# Patient Record
Sex: Female | Born: 1970 | Race: White | Hispanic: No | Marital: Married | Smoking: Never smoker
Health system: Southern US, Community
[De-identification: ages and names within clinical notes are randomized; demographics above are authoritative.]

## PROBLEM LIST (undated history)

## (undated) DIAGNOSIS — I10 Essential (primary) hypertension: Secondary | ICD-10-CM

## (undated) DIAGNOSIS — F32A Depression, unspecified: Secondary | ICD-10-CM

## (undated) DIAGNOSIS — M1990 Unspecified osteoarthritis, unspecified site: Secondary | ICD-10-CM

## (undated) DIAGNOSIS — G4730 Sleep apnea, unspecified: Secondary | ICD-10-CM

## (undated) DIAGNOSIS — F329 Major depressive disorder, single episode, unspecified: Secondary | ICD-10-CM

## (undated) DIAGNOSIS — E039 Hypothyroidism, unspecified: Secondary | ICD-10-CM

## (undated) DIAGNOSIS — D649 Anemia, unspecified: Secondary | ICD-10-CM

## (undated) HISTORY — PX: KNEE ARTHROSCOPY: SUR90

## (undated) HISTORY — PX: NASAL SEPTOPLASTY W/ TURBINOPLASTY: SHX2070

---

## 2006-01-03 ENCOUNTER — Emergency Department (HOSPITAL_COMMUNITY): Admission: EM | Admit: 2006-01-03 | Discharge: 2006-01-03 | Payer: Self-pay | Admitting: Emergency Medicine

## 2006-06-26 ENCOUNTER — Other Ambulatory Visit: Admission: RE | Admit: 2006-06-26 | Discharge: 2006-06-26 | Payer: Self-pay | Admitting: Family Medicine

## 2007-09-19 ENCOUNTER — Encounter (INDEPENDENT_AMBULATORY_CARE_PROVIDER_SITE_OTHER): Payer: Self-pay | Admitting: Urology

## 2007-09-19 ENCOUNTER — Ambulatory Visit (HOSPITAL_BASED_OUTPATIENT_CLINIC_OR_DEPARTMENT_OTHER): Admission: RE | Admit: 2007-09-19 | Discharge: 2007-09-19 | Payer: Self-pay | Admitting: Urology

## 2007-09-24 ENCOUNTER — Other Ambulatory Visit: Admission: RE | Admit: 2007-09-24 | Discharge: 2007-09-24 | Payer: Self-pay | Admitting: Family Medicine

## 2009-04-27 ENCOUNTER — Other Ambulatory Visit: Admission: RE | Admit: 2009-04-27 | Discharge: 2009-04-27 | Payer: Self-pay | Admitting: Obstetrics and Gynecology

## 2010-06-06 NOTE — Op Note (Signed)
[name], [name]               ACCOUNT NO.:  [id number]   MEDICAL RECORD NO.:  [id number]          PATIENT TYPE:  AMB   LOCATION:  [facility]                         FACILITY:  [facility]   PHYSICIAN:  Sigmund I. Patsi Sears, M.D.DATE OF BIRTH:  [birth year]   DATE OF PROCEDURE:  09/19/2007  DATE OF DISCHARGE:                               OPERATIVE REPORT   PREOPERATIVE DIAGNOSIS:  Interstitial cystitis.   POSTOPERATIVE DIAGNOSIS:  Interstitial cystitis.   OPERATION:  1. Cystourethroscopy.  2. Hydrodistention of bladder.  3. Bladder biopsy.  4. Cauterization of biopsy sites.  5. Instillation of Pyridium and Marcaine into the bladder.  6. Injection of Marcaine and Kenalog in the bladder base and trigonal      space.   SURGEON:  Sigmund I. Patsi Sears, M.D.   ANESTHESIA:  General LMA.   OPERATION:  After appropriate preanesthesia, the patient is brought to  operating room, placed on the operating room in the dorsal supine  position where general LMA anesthesia was induced.  She was then  replaced in dorsal lithotomy position where the pubis was prepped with  Betadine solution and draped in usual fashion.   BRIEF HISTORY:  Mrs. Chelsey Franco is a 40 year old female with a 3-year history  of pelvic pain, dyspareunia, urinary frequency, urgency.  She also has a  history of recurrent urinary tract infection.  She also has a history of  some cough, laugh and sneeze incontinence.  She has a  puff score of 13.   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  The bladder is small, and holds only 450 mL of  saline.  Bladder biopsies were obtained and sent for laboratory.  The  bladder as relatively smooth-walled.  Subcutaneous fat can be seen  through this smooth mucosa.  The bladder was drained of fluid, and  Pyridium and Marcaine solution inserted into the bladder.  Marcaine and  Kenalog solution was injected into the subtrigonal space.  The patient  was  awakened and taken to the recovery room in good condition.      Sigmund I. Patsi Sears, M.D.  Electronically Signed     SIT/MEDQ  D:  09/19/2007  T:  09/19/2007  Job:  [id number]

## 2012-07-10 ENCOUNTER — Other Ambulatory Visit (HOSPITAL_COMMUNITY)
Admission: RE | Admit: 2012-07-10 | Discharge: 2012-07-10 | Disposition: A | Discharge status: Home / Self Care | Payer: Commercial Indemnity | Source: Ambulatory Visit | Attending: Obstetrics and Gynecology | Admitting: Obstetrics and Gynecology

## 2012-07-10 ENCOUNTER — Other Ambulatory Visit: Payer: Self-pay | Admitting: Obstetrics and Gynecology

## 2012-07-10 DIAGNOSIS — Z01419 Encounter for gynecological examination (general) (routine) without abnormal findings: Secondary | ICD-10-CM | POA: Insufficient documentation

## 2012-07-10 DIAGNOSIS — Z1151 Encounter for screening for human papillomavirus (HPV): Secondary | ICD-10-CM | POA: Insufficient documentation

## 2012-08-11 ENCOUNTER — Other Ambulatory Visit: Payer: Self-pay | Admitting: Obstetrics and Gynecology

## 2013-07-08 ENCOUNTER — Emergency Department (HOSPITAL_BASED_OUTPATIENT_CLINIC_OR_DEPARTMENT_OTHER)
Admission: EM | Admit: 2013-07-08 | Discharge: 2013-07-09 | Disposition: A | Discharge status: Home / Self Care | Payer: Commercial Indemnity | Attending: Emergency Medicine | Admitting: Emergency Medicine

## 2013-07-08 ENCOUNTER — Encounter (HOSPITAL_BASED_OUTPATIENT_CLINIC_OR_DEPARTMENT_OTHER): Payer: Self-pay | Admitting: Emergency Medicine

## 2013-07-08 ENCOUNTER — Emergency Department (HOSPITAL_BASED_OUTPATIENT_CLINIC_OR_DEPARTMENT_OTHER): Payer: Commercial Indemnity

## 2013-07-08 DIAGNOSIS — F3289 Other specified depressive episodes: Secondary | ICD-10-CM | POA: Insufficient documentation

## 2013-07-08 DIAGNOSIS — Z79899 Other long term (current) drug therapy: Secondary | ICD-10-CM | POA: Insufficient documentation

## 2013-07-08 DIAGNOSIS — Z880 Allergy status to penicillin: Secondary | ICD-10-CM | POA: Insufficient documentation

## 2013-07-08 DIAGNOSIS — Z3202 Encounter for pregnancy test, result negative: Secondary | ICD-10-CM | POA: Insufficient documentation

## 2013-07-08 DIAGNOSIS — IMO0001 Reserved for inherently not codable concepts without codable children: Secondary | ICD-10-CM | POA: Insufficient documentation

## 2013-07-08 DIAGNOSIS — E039 Hypothyroidism, unspecified: Secondary | ICD-10-CM | POA: Insufficient documentation

## 2013-07-08 DIAGNOSIS — F329 Major depressive disorder, single episode, unspecified: Secondary | ICD-10-CM | POA: Insufficient documentation

## 2013-07-08 DIAGNOSIS — Z8739 Personal history of other diseases of the musculoskeletal system and connective tissue: Secondary | ICD-10-CM | POA: Insufficient documentation

## 2013-07-08 DIAGNOSIS — I10 Essential (primary) hypertension: Secondary | ICD-10-CM | POA: Insufficient documentation

## 2013-07-08 HISTORY — DX: Hypothyroidism, unspecified: E03.9

## 2013-07-08 HISTORY — DX: Unspecified osteoarthritis, unspecified site: M19.90

## 2013-07-08 HISTORY — DX: Essential (primary) hypertension: I10

## 2013-07-08 HISTORY — DX: Major depressive disorder, single episode, unspecified: F32.9

## 2013-07-08 HISTORY — DX: Depression, unspecified: F32.A

## 2013-07-08 NOTE — ED Notes (Signed)
Pt c/o "feeling off" today and took blood pressure at home. Pt states blood pressure at home was 150/105. Pt has been off of BP medication x 2 years with good control.

## 2013-07-08 NOTE — ED Provider Notes (Signed)
CSN: [id number]     Arrival date & time 07/08/13  2306 History  This chart was scribed for Chelsey Smitty CordsK Palumbo-Rasch, MD by Nicholos Johnsenise Iheanachor, ED scribe. This patient was seen in room MH02/MH02 and the patient's care was started at 11:25 PM.    Chief Complaint  Patient presents with  . Hypertension    Patient is a 43 y.o. female presenting with hypertension. The history is provided by the patient. No language interpreter was used.  Hypertension This is a recurrent problem. The current episode started 6 to 12 hours ago. The problem occurs constantly. The problem has been gradually worsening. Pertinent negatives include no chest pain, no abdominal pain, no headaches and no shortness of breath. Nothing aggravates the symptoms. Nothing relieves the symptoms. She has tried nothing for the symptoms. The treatment provided no relief.   HPI Comments: Tito DineSuzanne Davanzo is a 43 y.o. female who presents to the Emergency Department with high blood pressure. Says she has been "feeling off" since yesterday. Played tennis yesterday and believes she overexerted herself. Was also outside for an extended period of time in the heat for a swim meet. Reports some intermittent mild back pain. Believes that may be attributed to exercise class she took yesterday prior to tennis where there was a lot of upper body movement. Checked BP approximately 5.5 hours ago and states it was 150/105. Has been off of blood pressure medication for 2 years with good control. Started taking [place] 1 month ago; was taking [place] before. Also takes Synthroid, Wellbutrin, and Celexa regularly.  No CP sob doe no changes in vision or speech.  No focal weakness no numbness  Past Medical History  Diagnosis Date  . Hypertension   . Arthritis   . Depression   . Hypothyroid    Past Surgical History  Procedure Laterality Date  . Nasal septoplasty w/ turbinoplasty    . Knee arthroscopy     No family history on file. History  Substance Use Topics   . Smoking status: Never Smoker   . Smokeless tobacco: Not on file  . Alcohol Use: Yes   OB History   Grav Para Term Preterm Abortions TAB SAB Ect Mult Living                 Review of Systems  Constitutional: Negative for fever.  Respiratory: Negative for chest tightness, shortness of breath and wheezing.   Cardiovascular: Negative for chest pain, palpitations and leg swelling.  Gastrointestinal: Negative for vomiting and abdominal pain.  Musculoskeletal: Positive for myalgias.  Skin: Negative for rash.  Neurological: Negative for dizziness, facial asymmetry, weakness, numbness and headaches.  All other systems reviewed and are negative.  Allergies  Penicillins  Home Medications   Prior to Admission medications   Medication Sig Start Date End Date Taking? Authorizing Provider  Adalimumab (HUMIRA PEN [place]) Inject into the skin.   Yes Historical Provider, MD  BuPROPion HCl (WELLBUTRIN PO) Take by mouth.   Yes Historical Provider, MD  Citalopram Hydrobromide (CELEXA PO) Take by mouth.   Yes Historical Provider, MD  Levothyroxine Sodium (SYNTHROID PO) Take by mouth.   Yes Historical Provider, MD   Triage vitals: BP 179/110  Pulse 99  Temp(Src) 98.5 F (36.9 C) (Oral)  Resp 18  Ht 5\' 4"  (1.626 m)  Wt 180 lb (81.647 kg)  BMI 30.88 kg/m2  SpO2 100%  Physical Exam  Nursing note and vitals reviewed. Constitutional: She is oriented to person, place, and time. She appears well-developed  and well-nourished. No distress.  HENT:  Head: Normocephalic and atraumatic.  Mouth/Throat: Oropharynx is clear and moist. No oropharyngeal exudate.  Eyes: Conjunctivae and EOM are normal. Pupils are equal, round, and reactive to light.  Neck: Normal range of motion. Neck supple. No tracheal deviation present.  Cardiovascular: Normal rate, regular rhythm and normal heart sounds.   No murmur heard. Pulmonary/Chest: Effort normal and breath sounds normal. No respiratory distress. She has no  wheezes. She has no rales.  Abdominal: Soft. Bowel sounds are normal. There is no tenderness. There is no rebound and no guarding.  Musculoskeletal: Normal range of motion. She exhibits no edema and no tenderness.  Neurological: She is alert and oriented to person, place, and time. She has normal reflexes. No cranial nerve deficit.  Skin: Skin is warm and dry. No erythema.  Psychiatric: She has a normal mood and affect. Her behavior is normal.    ED Course  Procedures (including critical care time) DIAGNOSTIC STUDIES: Oxygen Saturation is 100% on room air, normal by my interpretation.    COORDINATION OF CARE: At 11:29 PM:  Discussed treatment plan with patient which includes lab work and follow-up with PCP. Patient agrees.    Labs Review Labs Reviewed - No data to display  Imaging Review No results found.   EKG Interpretation None     MDM   Final diagnoses:  None   Labs normal.  BP on recheck is markedly improved without intervention.  Recommend keeping BP log, must be seated 5-10 minutes.  Increasing water consumption and decreasing salt intake and close follow up with Dr. Valentina LucksGriffin for recheck.   I personally performed the services described in this documentation, which was scribed in my presence. The recorded information has been reviewed and is accurate.     Jasmine AweApril K Palumbo-Rasch, MD 07/09/13 [phone]

## 2013-07-08 NOTE — ED Notes (Signed)
Not feeling right yesterday,  Took bp tonight and it was high  Started exercise class yesterday

## 2013-07-09 ENCOUNTER — Encounter (HOSPITAL_BASED_OUTPATIENT_CLINIC_OR_DEPARTMENT_OTHER): Payer: Self-pay | Admitting: Emergency Medicine

## 2013-07-09 LAB — URINE MICROSCOPIC-ADD ON

## 2013-07-09 LAB — URINALYSIS, ROUTINE W REFLEX MICROSCOPIC
Bilirubin Urine: NEGATIVE
GLUCOSE, UA: NEGATIVE mg/dL
Ketones, ur: NEGATIVE mg/dL
Nitrite: NEGATIVE
Protein, ur: NEGATIVE mg/dL
Specific Gravity, Urine: 1.009 (ref 1.005–1.030)
Urobilinogen, UA: 0.2 mg/dL (ref 0.0–1.0)
pH: 6.5 (ref 5.0–8.0)

## 2013-07-09 LAB — BASIC METABOLIC PANEL
BUN: 12 mg/dL (ref 6–23)
CHLORIDE: 104 meq/L (ref 96–112)
CO2: 24 meq/L (ref 19–32)
CREATININE: 0.8 mg/dL (ref 0.50–1.10)
Calcium: 9.5 mg/dL (ref 8.4–10.5)
GFR calc Af Amer: >90 mL/min (ref >90)
GFR calc non Af Amer: 88 mL/min — ABNORMAL LOW (ref >90)
GLUCOSE: 111 mg/dL — AB (ref 70–99)
Potassium: 3.9 mEq/L (ref 3.7–5.3)
Sodium: 141 mEq/L (ref 137–147)

## 2013-07-09 LAB — PREGNANCY, URINE: Preg Test, Ur: NEGATIVE

## 2013-07-09 NOTE — Discharge Instructions (Signed)
DASH Eating Plan  DASH stands for "Dietary Approaches to Stop Hypertension." The DASH eating plan is a healthy eating plan that has been shown to reduce high blood pressure (hypertension). Additional health benefits may include reducing the risk of type 2 diabetes mellitus, heart disease, and stroke. The DASH eating plan may also help with weight loss.  WHAT DO I NEED TO KNOW ABOUT THE DASH EATING PLAN?  For the DASH eating plan, you will follow these general guidelines:  · Choose foods with a percent daily value for sodium of less than 5% (as listed on the food label).  · Use salt-free seasonings or herbs instead of table salt or sea salt.  · Check with your health care provider or pharmacist before using salt substitutes.  · Eat lower-sodium products, often labeled as "lower sodium" or "no salt added."  · Eat fresh foods.  · Eat more vegetables, fruits, and low-fat dairy products.  · Choose whole grains. Look for the word "whole" as the first word in the ingredient list.  · Choose fish and skinless chicken or turkey more often than red meat. Limit fish, poultry, and meat to 6 oz (170 g) each day.  · Limit sweets, desserts, sugars, and sugary drinks.  · Choose heart-healthy fats.  · Limit cheese to 1 oz (28 g) per day.  · Eat more home-cooked food and less restaurant, buffet, and fast food.  · Limit fried foods.  · Cook foods using methods other than frying.  · Limit canned vegetables. If you do use them, rinse them well to decrease the sodium.  · When eating at a restaurant, ask that your food be prepared with less salt, or no salt if possible.  WHAT FOODS CAN I EAT?  Seek help from a dietitian for individual calorie needs.  Grains  Whole grain or whole wheat bread. Brown rice. Whole grain or whole wheat pasta. Quinoa, bulgur, and whole grain cereals. Low-sodium cereals. Corn or whole wheat flour tortillas. Whole grain cornbread. Whole grain crackers. Low-sodium crackers.  Vegetables  Fresh or frozen vegetables  (raw, steamed, roasted, or grilled). Low-sodium or reduced-sodium tomato and vegetable juices. Low-sodium or reduced-sodium tomato sauce and paste. Low-sodium or reduced-sodium canned vegetables.   Fruits  All fresh, canned (in natural juice), or frozen fruits.  Meat and Other Protein Products  Ground beef (85% or leaner), grass-fed beef, or beef trimmed of fat. Skinless chicken or turkey. Ground chicken or turkey. Pork trimmed of fat. All fish and seafood. Eggs. Dried beans, peas, or lentils. Unsalted nuts and seeds. Unsalted canned beans.  Dairy  Low-fat dairy products, such as skim or 1% milk, 2% or reduced-fat cheeses, low-fat ricotta or cottage cheese, or plain low-fat yogurt. Low-sodium or reduced-sodium cheeses.  Fats and Oils  Tub margarines without trans fats. Light or reduced-fat mayonnaise and salad dressings (reduced sodium). Avocado. Safflower, olive, or canola oils. Natural peanut or almond butter.  Other  Unsalted popcorn and pretzels.  The items listed above may not be a complete list of recommended foods or beverages. Contact your dietitian for more options.  WHAT FOODS ARE NOT RECOMMENDED?  Grains  White bread. White pasta. White rice. Refined cornbread. Bagels and croissants. Crackers that contain trans fat.  Vegetables  Creamed or fried vegetables. Vegetables in a cheese sauce. Regular canned vegetables. Regular canned tomato sauce and paste. Regular tomato and vegetable juices.  Fruits  Dried fruits. Canned fruit in light or heavy syrup. Fruit juice.  Meat and Other Protein   Products  Fatty cuts of meat. Ribs, chicken wings, bacon, sausage, bologna, salami, chitterlings, fatback, hot dogs, bratwurst, and packaged luncheon meats. Salted nuts and seeds. Canned beans with salt.  Dairy  Whole or 2% milk, cream, half-and-half, and cream cheese. Whole-fat or sweetened yogurt. Full-fat cheeses or blue cheese. Nondairy creamers and whipped toppings. Processed cheese, cheese spreads, or cheese  curds.  Condiments  Onion and garlic salt, seasoned salt, table salt, and sea salt. Canned and packaged gravies. Worcestershire sauce. Tartar sauce. Barbecue sauce. Teriyaki sauce. Soy sauce, including reduced sodium. Steak sauce. Fish sauce. Oyster sauce. Cocktail sauce. Horseradish. Ketchup and mustard. Meat flavorings and tenderizers. Bouillon cubes. Hot sauce. Tabasco sauce. Marinades. Taco seasonings. Relishes.  Fats and Oils  Butter, stick margarine, lard, shortening, ghee, and bacon fat. Coconut, palm kernel, or palm oils. Regular salad dressings.  Other  Pickles and olives. Salted popcorn and pretzels.  The items listed above may not be a complete list of foods and beverages to avoid. Contact your dietitian for more information.  WHERE CAN I FIND MORE INFORMATION?  National Heart, Lung, and Blood Institute: www.nhlbi.nih.gov/health/health-topics/topics/dash/  Document Released: 12/28/2010 Document Revised: 01/13/2013 Document Reviewed: 11/12/2012  ExitCare® Patient Information ©2015 [employer]. This information is not intended to replace advice given to you by your health care provider. Make sure you discuss any questions you have with your health care provider.

## 2015-03-21 ENCOUNTER — Encounter (HOSPITAL_COMMUNITY): Payer: Self-pay | Admitting: *Deleted

## 2015-03-21 ENCOUNTER — Emergency Department (HOSPITAL_COMMUNITY): Payer: Managed Care, Other (non HMO)

## 2015-03-21 ENCOUNTER — Emergency Department (HOSPITAL_COMMUNITY)
Admission: EM | Admit: 2015-03-21 | Discharge: 2015-03-21 | Disposition: A | Discharge status: Home / Self Care | Payer: Managed Care, Other (non HMO) | Attending: Emergency Medicine | Admitting: Emergency Medicine

## 2015-03-21 DIAGNOSIS — E039 Hypothyroidism, unspecified: Secondary | ICD-10-CM | POA: Insufficient documentation

## 2015-03-21 DIAGNOSIS — R079 Chest pain, unspecified: Secondary | ICD-10-CM | POA: Diagnosis present

## 2015-03-21 DIAGNOSIS — I10 Essential (primary) hypertension: Secondary | ICD-10-CM | POA: Insufficient documentation

## 2015-03-21 DIAGNOSIS — G4700 Insomnia, unspecified: Secondary | ICD-10-CM

## 2015-03-21 DIAGNOSIS — F329 Major depressive disorder, single episode, unspecified: Secondary | ICD-10-CM | POA: Insufficient documentation

## 2015-03-21 DIAGNOSIS — R072 Precordial pain: Secondary | ICD-10-CM | POA: Diagnosis not present

## 2015-03-21 DIAGNOSIS — Z880 Allergy status to penicillin: Secondary | ICD-10-CM | POA: Diagnosis not present

## 2015-03-21 DIAGNOSIS — L309 Dermatitis, unspecified: Secondary | ICD-10-CM | POA: Diagnosis not present

## 2015-03-21 DIAGNOSIS — M549 Dorsalgia, unspecified: Secondary | ICD-10-CM | POA: Diagnosis not present

## 2015-03-21 LAB — CBC
HCT: 39.1 % (ref 36.0–46.0)
Hemoglobin: 12.9 g/dL (ref 12.0–15.0)
MCH: 29.5 pg (ref 26.0–34.0)
MCHC: 33 g/dL (ref 30.0–36.0)
MCV: 89.3 fL (ref 78.0–100.0)
PLATELETS: 479 10*3/uL — AB (ref 150–400)
RBC: 4.38 MIL/uL (ref 3.87–5.11)
RDW: 14.1 % (ref 11.5–15.5)
WBC: 8.6 10*3/uL (ref 4.0–10.5)

## 2015-03-21 LAB — BASIC METABOLIC PANEL
Anion gap: 12 (ref 5–15)
BUN: 13 mg/dL (ref 6–20)
CALCIUM: 10 mg/dL (ref 8.9–10.3)
CO2: 21 mmol/L — AB (ref 22–32)
Chloride: 106 mmol/L (ref 101–111)
Creatinine, Ser: 0.81 mg/dL (ref 0.44–1.00)
GFR calc non Af Amer: >60 mL/min (ref >60)
Glucose, Bld: 125 mg/dL — ABNORMAL HIGH (ref 65–99)
Potassium: 5 mmol/L (ref 3.5–5.1)
SODIUM: 139 mmol/L (ref 135–145)

## 2015-03-21 LAB — I-STAT TROPONIN, ED
TROPONIN I, POC: 0 ng/mL (ref 0.00–0.08)
Troponin i, poc: 0 ng/mL (ref 0.00–0.08)

## 2015-03-21 MED ORDER — ZOLPIDEM TARTRATE 5 MG PO TABS: 5.0000 mg | ORAL_TABLET | Freq: Every evening | ORAL | Status: DC | PRN

## 2015-03-21 NOTE — ED Provider Notes (Signed)
CSN: [id number]     Arrival date & time 03/21/15  1300 History   First MD Initiated Contact with Patient 03/21/15 1719     Chief Complaint  Patient presents with  . Chest Pain  . Back Pain     (Consider location/radiation/quality/duration/timing/severity/associated sxs/prior Treatment) Patient is a 45 y.o. female presenting with chest pain and back pain. The history is provided by the patient.  Chest Pain Associated symptoms: back pain   Associated symptoms: no abdominal pain, no cough, no fever, no headache, no palpitations, no shortness of breath and not vomiting   Back Pain Associated symptoms: chest pain   Associated symptoms: no abdominal pain, no fever and no headaches   Patient w onset acute left lateral chest pain this morning.  Onset while sleeping at 3 AM.  States had been persistent since, at rest. Some episodes quick twinges lasting seconds, other times for hours.  No relation to position and/or activity level. No recent exertional chest pain or discomfort. No unusual doe or fatigue.  No diaphoresis. No nv. Hx htn, off meds x 2 years, and then back on bp med 1-2 weeks ago. Non smoker. Dad w mi at age 43.  Pt reports neg prior stress several yrs ago.  No pleuritic and/or constant chest pain. No leg pain or swelling. No hx recent surgery, immobility or trauma.  ?recent heartburn, started prilosec yesterday.       Past Medical History  Diagnosis Date  . Hypertension   . Arthritis   . Depression   . Hypothyroid    Past Surgical History  Procedure Laterality Date  . Nasal septoplasty w/ turbinoplasty    . Knee arthroscopy     History reviewed. No pertinent family history. Social History  Substance Use Topics  . Smoking status: Never Smoker   . Smokeless tobacco: None  . Alcohol Use: Yes   OB History    No data available     Review of Systems  Constitutional: Negative for fever and chills.  HENT: Negative for sore throat.   Eyes: Negative for redness.   Respiratory: Negative for cough and shortness of breath.   Cardiovascular: Positive for chest pain. Negative for palpitations and leg swelling.  Gastrointestinal: Negative for vomiting and abdominal pain.  Genitourinary: Negative for flank pain.  Musculoskeletal: Positive for back pain. Negative for neck pain.  Skin: Positive for rash.       Sparse, pruritic, erythematous rash, started a couple weeks ago anterior chest, now w small patches on upper back. No hx same. No new home or personal products. Has started bl med and wellbutrin last week, but was on previously without rxn, and rash preceding restarting these meds. Has been staying at home, no others at home w similar rash, no hotels stays, no outdoor exposure to garden/forest/plant material. No mm lesions or palms/soles.   Neurological: Negative for headaches.  Hematological: Does not bruise/bleed easily.  Psychiatric/Behavioral: Negative for confusion.      Allergies  Penicillins  Home Medications   Prior to Admission medications   Medication Sig Start Date End Date Taking? Authorizing Provider  Adalimumab (HUMIRA PEN [place]) Inject into the skin.    Historical Provider, MD  BuPROPion HCl (WELLBUTRIN PO) Take by mouth.    Historical Provider, MD  Citalopram Hydrobromide (CELEXA PO) Take by mouth.    Historical Provider, MD  Levothyroxine Sodium (SYNTHROID PO) Take by mouth.    Historical Provider, MD   BP 163/99 mmHg  Pulse 95  Temp(Src)  98.8 F (37.1 C) (Oral)  Resp 16  SpO2 100%  LMP 02/24/2015 Physical Exam  Constitutional: She appears well-developed and well-nourished. No distress.  HENT:  Nose: Nose normal.  Mouth/Throat: Oropharynx is clear and moist.  Eyes: Conjunctivae are normal. No scleral icterus.  Neck: Neck supple. No tracheal deviation present.  Cardiovascular: Normal rate, regular rhythm, normal heart sounds and intact distal pulses.  Exam reveals no gallop and no friction rub.   No murmur  heard. Pulmonary/Chest: Effort normal and breath sounds normal. No respiratory distress. She exhibits no tenderness.  Abdominal: Soft. Normal appearance and bowel sounds are normal. She exhibits no distension. There is no tenderness.  Musculoskeletal: She exhibits no edema or tenderness.  Neurological: She is alert.  Skin: Skin is warm and dry. No rash noted. She is not diaphoretic.  Sparse, erythematous rash w small patches to anterior upper chest, and mid to upper back.  +blanches. No burrows/tracts. No infection/cellulitis.   Psychiatric: She has a normal mood and affect.  Nursing note and vitals reviewed.   ED Course  Procedures (including critical care time) Labs Review  Results for orders placed or performed during the hospital encounter of 03/21/15  Basic metabolic panel  Result Value Ref Range   Sodium 139 135 - 145 mmol/L   Potassium 5.0 3.5 - 5.1 mmol/L   Chloride 106 101 - 111 mmol/L   CO2 21 (L) 22 - 32 mmol/L   Glucose, Bld 125 (H) 65 - 99 mg/dL   BUN 13 6 - 20 mg/dL   Creatinine, Ser 1.61 0.44 - 1.00 mg/dL   Calcium 09.6 8.9 - 04.5 mg/dL   GFR calc non Af Amer >60 >60 mL/min   GFR calc Af Amer >60 >60 mL/min   Anion gap 12 5 - 15  CBC  Result Value Ref Range   WBC 8.6 4.0 - 10.5 K/uL   RBC 4.38 3.87 - 5.11 MIL/uL   Hemoglobin 12.9 12.0 - 15.0 g/dL   HCT 40.9 81.1 - 91.4 %   MCV 89.3 78.0 - 100.0 fL   MCH 29.5 26.0 - 34.0 pg   MCHC 33.0 30.0 - 36.0 g/dL   RDW 78.2 95.6 - 21.3 %   Platelets 479 (H) 150 - 400 K/uL  I-stat troponin, ED (not at [facility])  Result Value Ref Range   Troponin i, poc 0.00 0.00 - 0.08 ng/mL   Comment 3           Dg Chest 2 View  03/21/2015  CLINICAL DATA:  LEFT side pain into back and chest since 0300 hours today, shortness of breath, dizziness, nausea, history hypertension EXAM: CHEST  2 VIEW COMPARISON:  07/08/2013 FINDINGS: Normal heart size, mediastinal contours, and pulmonary vascularity. Lungs clear. No pulmonary infiltrate,  pleural effusion, or pneumothorax. Bones unremarkable. IMPRESSION: Normal exam. Electronically Signed   By: Ulyses Southward M.D.   On: 03/21/2015 13:47   '     I have personally reviewed and evaluated these images and lab results as part of my medical decision-making.   EKG Interpretation   Date/Time:  Monday March 21 2015 13:13:05 EST Ventricular Rate:  103 PR Interval:  138 QRS Duration: 92 QT Interval:  338 QTC Calculation: 442 R Axis:   43 Text Interpretation:  Sinus tachycardia No previous tracing Confirmed by  Denton Lank  MD, Caryn Bee ([id number]) on 03/21/2015 5:20:29 PM      MDM   Iv ns. Monitor. Labs. Cxr.  Reviewed nursing notes and prior charts  for additional history.   Will get repeat/delta trop.  For dermatitis, rec topical/hydrocortisone cream, benadryl, pcp f/u.   Chest pain atypical, at rest. After symptoms since 0300 trop x 2 neg.  Symptoms/workup feel not c/w acs.  Pain intermittent, some episodes lasting seconds, not pleuritic, no dyspnea - does not appear c/w PE.    rec close pcp f/u.  Given recurrent cp, fam hx cad, and many yrs since prior stress, rec cardiology f/u for possible stress testing.   Pt also does acknowledge recent increased stressed, and possible recent gi/reflux symptoms, rec pcp f/u for that, and including for recheck bp as high today.  Pt requests med to help for sleep as has had trouble sleeping in past couple weeks.  Will give small quantity  Ambien, prn.          Cathren Laine, MD 03/21/15 1900

## 2015-03-21 NOTE — ED Notes (Signed)
Pt reports onset last night of pain that radiated under her left arm. This am having left side chest pains and mid back pain. Denies n/v or sob. ekg done at triage, airway intact.

## 2015-03-21 NOTE — Discharge Instructions (Signed)
It was our pleasure to provide your ER care today - we hope that you feel better.  For sleep - see insomnia instructional sheet.  You may take ambien as need for sleep - no driving when taking.  For chest discomfort, follow up with cardiologist in 1 week - see referral.  If reflux/gi symptoms, try taking pepcid and maalox as need for symptom relief.  For rash, take benadryl as need for itching, and may apply thin coat of hydrocortisone cream (available over the counter) to areas 2-3x/day as needed.   For blood pressure, continue your blood pressure medication, and follow up with your doctor in the coming week for recheck.  Return to ER if worse, recurrent or persistent chest pain, trouble breathing, high fevers, other concern.       Nonspecific Chest Pain  Chest pain can be caused by many different conditions. There is always a chance that your pain could be related to something serious, such as a heart attack or a blood clot in your lungs. Chest pain can also be caused by conditions that are not life-threatening. If you have chest pain, it is very important to follow up with your health care provider. CAUSES  Chest pain can be caused by:  Heartburn.  Pneumonia or bronchitis.  Anxiety or stress.  Inflammation around your heart (pericarditis) or lung (pleuritis or pleurisy).  A blood clot in your lung.  A collapsed lung (pneumothorax). It can develop suddenly on its own (spontaneous pneumothorax) or from trauma to the chest.  Shingles infection (varicella-zoster virus).  Heart attack.  Damage to the bones, muscles, and cartilage that make up your chest wall. This can include:  Bruised bones due to injury.  Strained muscles or cartilage due to frequent or repeated coughing or overwork.  Fracture to one or more ribs.  Sore cartilage due to inflammation (costochondritis). RISK FACTORS  Risk factors for chest pain may include:  Activities that increase your risk for  trauma or injury to your chest.  Respiratory infections or conditions that cause frequent coughing.  Medical conditions or overeating that can cause heartburn.  Heart disease or family history of heart disease.  Conditions or health behaviors that increase your risk of developing a blood clot.  Having had chicken pox (varicella zoster). SIGNS AND SYMPTOMS Chest pain can feel like:  Burning or tingling on the surface of your chest or deep in your chest.  Crushing, pressure, aching, or squeezing pain.  Dull or sharp pain that is worse when you move, cough, or take a deep breath.  Pain that is also felt in your back, neck, shoulder, or arm, or pain that spreads to any of these areas. Your chest pain may come and go, or it may stay constant. DIAGNOSIS Lab tests or other studies may be needed to find the cause of your pain. Your health care provider may have you take a test called an ambulatory ECG (electrocardiogram). An ECG records your heartbeat patterns at the time the test is performed. You may also have other tests, such as:  Transthoracic echocardiogram (TTE). During echocardiography, sound waves are used to create a picture of all of the heart structures and to look at how blood flows through your heart.  Transesophageal echocardiogram (TEE).This is a more advanced imaging test that obtains images from inside your body. It allows your health care provider to see your heart in finer detail.  Cardiac monitoring. This allows your health care provider to monitor your heart rate and  rhythm in real time.  Holter monitor. This is a portable device that records your heartbeat and can help to diagnose abnormal heartbeats. It allows your health care provider to track your heart activity for several days, if needed.  Stress tests. These can be done through exercise or by taking medicine that makes your heart beat more quickly.  Blood tests.  Imaging tests. TREATMENT  Your treatment  depends on what is causing your chest pain. Treatment may include:  Medicines. These may include:  Acid blockers for heartburn.  Anti-inflammatory medicine.  Pain medicine for inflammatory conditions.  Antibiotic medicine, if an infection is present.  Medicines to dissolve blood clots.  Medicines to treat coronary artery disease.  Supportive care for conditions that do not require medicines. This may include:  Resting.  Applying heat or cold packs to injured areas.  Limiting activities until pain decreases. HOME CARE INSTRUCTIONS  If you were prescribed an antibiotic medicine, finish it all even if you start to feel better.  Avoid any activities that bring on chest pain.  Do not use any tobacco products, including cigarettes, chewing tobacco, or electronic cigarettes. If you need help quitting, ask your health care provider.  Do not drink alcohol.  Take medicines only as directed by your health care provider.  Keep all follow-up visits as directed by your health care provider. This is important. This includes any further testing if your chest pain does not go away.  If heartburn is the cause for your chest pain, you may be told to keep your head raised (elevated) while sleeping. This reduces the chance that acid will go from your stomach into your esophagus.  Make lifestyle changes as directed by your health care provider. These may include:  Getting regular exercise. Ask your health care provider to suggest some activities that are safe for you.  Eating a heart-healthy diet. A registered dietitian can help you to learn healthy eating options.  Maintaining a healthy weight.  Managing diabetes, if necessary.  Reducing stress. SEEK MEDICAL CARE IF:  Your chest pain does not go away after treatment.  You have a rash with blisters on your chest.  You have a fever. SEEK IMMEDIATE MEDICAL CARE IF:   Your chest pain is worse.  You have an increasing cough, or you  cough up blood.  You have severe abdominal pain.  You have severe weakness.  You faint.  You have chills.  You have sudden, unexplained chest discomfort.  You have sudden, unexplained discomfort in your arms, back, neck, or jaw.  You have shortness of breath at any time.  You suddenly start to sweat, or your skin gets clammy.  You feel nauseous or you vomit.  You suddenly feel light-headed or dizzy.  Your heart begins to beat quickly, or it feels like it is skipping beats. These symptoms may represent a serious problem that is an emergency. Do not wait to see if the symptoms will go away. Get medical help right away. Call your local emergency services (911 in the [place] Do not drive yourself to the hospital.   This information is not intended to replace advice given to you by your health care provider. Make sure you discuss any questions you have with your health care provider.   Document Released: 10/18/2004 Document Revised: 01/29/2014 Document Reviewed: 08/14/2013 Elsevier Interactive Patient Education 2016 [employer].  Hypertension Hypertension, commonly called high blood pressure, is when the force of blood pumping through your arteries is too strong. Your  arteries are the blood vessels that carry blood from your heart throughout your body. A blood pressure reading consists of a higher number over a lower number, such as 110/72. The higher number (systolic) is the pressure inside your arteries when your heart pumps. The lower number (diastolic) is the pressure inside your arteries when your heart relaxes. Ideally you want your blood pressure below 120/80. Hypertension forces your heart to work harder to pump blood. Your arteries may become narrow or stiff. Having untreated or uncontrolled hypertension can cause heart attack, stroke, kidney disease, and other problems. RISK FACTORS Some risk factors for high blood pressure are controllable. Others are not.  Risk factors you  cannot control include:   Race. You may be at higher risk if you are African American.  Age. Risk increases with age.  Gender. Men are at higher risk than women before age 12 years. After age 17, women are at higher risk than men. Risk factors you can control include:  Not getting enough exercise or physical activity.  Being overweight.  Getting too much fat, sugar, calories, or salt in your diet.  Drinking too much alcohol. SIGNS AND SYMPTOMS Hypertension does not usually cause signs or symptoms. Extremely high blood pressure (hypertensive crisis) may cause headache, anxiety, shortness of breath, and nosebleed. DIAGNOSIS To check if you have hypertension, your health care provider will measure your blood pressure while you are seated, with your arm held at the level of your heart. It should be measured at least twice using the same arm. Certain conditions can cause a difference in blood pressure between your right and left arms. A blood pressure reading that is higher than normal on one occasion does not mean that you need treatment. If it is not clear whether you have high blood pressure, you may be asked to return on a different day to have your blood pressure checked again. Or, you may be asked to monitor your blood pressure at home for 1 or more weeks. TREATMENT Treating high blood pressure includes making lifestyle changes and possibly taking medicine. Living a healthy lifestyle can help lower high blood pressure. You may need to change some of your habits. Lifestyle changes may include:  Following the DASH diet. This diet is high in fruits, vegetables, and whole grains. It is low in salt, red meat, and added sugars.  Keep your sodium intake below 2,300 mg per day.  Getting at least 30-45 minutes of aerobic exercise at least 4 times per week.  Losing weight if necessary.  Not smoking.  Limiting alcoholic beverages.  Learning ways to reduce stress. Your health care provider  may prescribe medicine if lifestyle changes are not enough to get your blood pressure under control, and if one of the following is true:  You are 25-56 years of age and your systolic blood pressure is above 140.  You are 59 years of age or older, and your systolic blood pressure is above 150.  Your diastolic blood pressure is above 90.  You have diabetes, and your systolic blood pressure is over 140 or your diastolic blood pressure is over 90.  You have kidney disease and your blood pressure is above 140/90.  You have heart disease and your blood pressure is above 140/90. Your personal target blood pressure may vary depending on your medical conditions, your age, and other factors. HOME CARE INSTRUCTIONS  Have your blood pressure rechecked as directed by your health care provider.   Take medicines only as directed  by your health care provider. Follow the directions carefully. Blood pressure medicines must be taken as prescribed. The medicine does not work as well when you skip doses. Skipping doses also puts you at risk for problems.  Do not smoke.   Monitor your blood pressure at home as directed by your health care provider. SEEK MEDICAL CARE IF:   You think you are having a reaction to medicines taken.  You have recurrent headaches or feel dizzy.  You have swelling in your ankles.  You have trouble with your vision. SEEK IMMEDIATE MEDICAL CARE IF:  You develop a severe headache or confusion.  You have unusual weakness, numbness, or feel faint.  You have severe chest or abdominal pain.  You vomit repeatedly.  You have trouble breathing. MAKE SURE YOU:   Understand these instructions.  Will watch your condition.  Will get help right away if you are not doing well or get worse.   This information is not intended to replace advice given to you by your health care provider. Make sure you discuss any questions you have with your health care provider.   Document  Released: 01/08/2005 Document Revised: 05/25/2014 Document Reviewed: 10/31/2012 Elsevier Interactive Patient Education 2016 [employer] Inc[employer]    Contact Dermatitis Dermatitis is redness, soreness, and swelling (inflammation) of the skin. Contact dermatitis is a reaction to certain substances that touch the skin. There are two types of contact dermatitis:   Irritant contact dermatitis. This type is caused by something that irritates your skin, such as dry hands from washing them too much. This type does not require previous exposure to the substance for a reaction to occur. This type is more common.  Allergic contact dermatitis. This type is caused by a substance that you are allergic to, such as a nickel allergy or poison ivy. This type only occurs if you have been exposed to the substance (allergen) before. Upon a repeat exposure, your body reacts to the substance. This type is less common. CAUSES  Many different substances can cause contact dermatitis. Irritant contact dermatitis is most commonly caused by exposure to:   Makeup.   Soaps.   Detergents.   Bleaches.   Acids.   Metal salts, such as nickel.  Allergic contact dermatitis is most commonly caused by exposure to:   Poisonous plants.   Chemicals.   Jewelry.   Latex.   Medicines.   Preservatives in products, such as clothing.  RISK FACTORS This condition is more likely to develop in:   People who have jobs that expose them to irritants or allergens.  People who have certain medical conditions, such as asthma or eczema.  SYMPTOMS  Symptoms of this condition may occur anywhere on your body where the irritant has touched you or is touched by you. Symptoms include:  Dryness or flaking.   Redness.   Cracks.   Itching.   Pain or a burning feeling.   Blisters.  Drainage of small amounts of blood or clear fluid from skin cracks. With allergic contact dermatitis, there may also be swelling in  areas such as the eyelids, mouth, or genitals.  DIAGNOSIS  This condition is diagnosed with a medical history and physical exam. A patch skin test may be performed to help determine the cause. If the condition is related to your job, you may need to see an occupational medicine specialist. TREATMENT Treatment for this condition includes figuring out what caused the reaction and protecting your skin from further contact. Treatment may also  include:   Steroid creams or ointments. Oral steroid medicines may be needed in more severe cases.  Antibiotics or antibacterial ointments, if a skin infection is present.  Antihistamine lotion or an antihistamine taken by mouth to ease itching.  A bandage (dressing). HOME CARE INSTRUCTIONS Skin Care  Moisturize your skin as needed.   Apply cool compresses to the affected areas.  Try taking a bath with:  Epsom salts. Follow the instructions on the packaging. You can get these at your local pharmacy or grocery store.  Baking soda. Pour a small amount into the bath as directed by your health care provider.  Colloidal oatmeal. Follow the instructions on the packaging. You can get this at your local pharmacy or grocery store.  Try applying baking soda paste to your skin. Stir water into baking soda until it reaches a paste-like consistency.  Do not scratch your skin.  Bathe less frequently, such as every other day.  Bathe in lukewarm water. Avoid using hot water. Medicines  Take or apply over-the-counter and prescription medicines only as told by your health care provider.   If you were prescribed an antibiotic medicine, take or apply your antibiotic as told by your health care provider. Do not stop using the antibiotic even if your condition starts to improve. General Instructions  Keep all follow-up visits as told by your health care provider. This is important.  Avoid the substance that caused your reaction. If you do not know what  caused it, keep a journal to try to track what caused it. Write down:  What you eat.  What cosmetic products you use.  What you drink.  What you wear in the affected area. This includes jewelry.  If you were given a dressing, take care of it as told by your health care provider. This includes when to change and remove it. SEEK MEDICAL CARE IF:   Your condition does not improve with treatment.  Your condition gets worse.  You have signs of infection such as swelling, tenderness, redness, soreness, or warmth in the affected area.  You have a fever.  You have new symptoms. SEEK IMMEDIATE MEDICAL CARE IF:   You have a severe headache, neck pain, or neck stiffness.  You vomit.  You feel very sleepy.  You notice red streaks coming from the affected area.  Your bone or joint underneath the affected area becomes painful after the skin has healed.  The affected area turns darker.  You have difficulty breathing.   This information is not intended to replace advice given to you by your health care provider. Make sure you discuss any questions you have with your health care provider.   Document Released: 01/06/2000 Document Revised: 09/29/2014 Document Reviewed: 05/26/2014 Elsevier Interactive Patient Education 2016 [employer] Inc[employer]    Insomnia Insomnia is a sleep disorder that makes it difficult to fall asleep or to stay asleep. Insomnia can cause tiredness (fatigue), low energy, difficulty concentrating, mood swings, and poor performance at work or school.  There are three different ways to classify insomnia:  Difficulty falling asleep.  Difficulty staying asleep.  Waking up too early in the morning. Any type of insomnia can be long-term (chronic) or short-term (acute). Both are common. Short-term insomnia usually lasts for three months or less. Chronic insomnia occurs at least three times a week for longer than three months. CAUSES  Insomnia may be caused by another  condition, situation, or substance, such as:  Anxiety.  Certain medicines.  Gastroesophageal reflux disease (  GERD) or other gastrointestinal conditions.  Asthma or other breathing conditions.  Restless legs syndrome, sleep apnea, or other sleep disorders.  Chronic pain.  Menopause. This may include hot flashes.  Stroke.  Abuse of alcohol, tobacco, or illegal drugs.  Depression.  Caffeine.   Neurological disorders, such as Alzheimer disease.  An overactive thyroid (hyperthyroidism). The cause of insomnia may not be known. RISK FACTORS Risk factors for insomnia include:  Gender. Women are more commonly affected than men.  Age. Insomnia is more common as you get older.  Stress. This may involve your professional or personal life.  Income. Insomnia is more common in people with lower income.  Lack of exercise.   Irregular work schedule or night shifts.  Traveling between different time zones. SIGNS AND SYMPTOMS If you have insomnia, trouble falling asleep or trouble staying asleep is the main symptom. This may lead to other symptoms, such as:  Feeling fatigued.  Feeling nervous about going to sleep.  Not feeling rested in the morning.  Having trouble concentrating.  Feeling irritable, anxious, or depressed. TREATMENT  Treatment for insomnia depends on the cause. If your insomnia is caused by an underlying condition, treatment will focus on addressing the condition. Treatment may also include:   Medicines to help you sleep.  Counseling or therapy.  Lifestyle adjustments. HOME CARE INSTRUCTIONS   Take medicines only as directed by your health care provider.  Keep regular sleeping and waking hours. Avoid naps.  Keep a sleep diary to help you and your health care provider figure out what could be causing your insomnia. Include:   When you sleep.  When you wake up during the night.  How well you sleep.   How rested you feel the next day.  Any  side effects of medicines you are taking.  What you eat and drink.   Make your bedroom a comfortable place where it is easy to fall asleep:  Put up shades or special blackout curtains to block light from outside.  Use a white noise machine to block noise.  Keep the temperature cool.   Exercise regularly as directed by your health care provider. Avoid exercising right before bedtime.  Use relaxation techniques to manage stress. Ask your health care provider to suggest some techniques that may work well for you. These may include:  Breathing exercises.  Routines to release muscle tension.  Visualizing peaceful scenes.  Cut back on alcohol, caffeinated beverages, and cigarettes, especially close to bedtime. These can disrupt your sleep.  Do not overeat or eat spicy foods right before bedtime. This can lead to digestive discomfort that can make it hard for you to sleep.  Limit screen use before bedtime. This includes:  Watching TV.  Using your smartphone, tablet, and computer.  Stick to a routine. This can help you fall asleep faster. Try to do a quiet activity, brush your teeth, and go to bed at the same time each night.  Get out of bed if you are still awake after 15 minutes of trying to sleep. Keep the lights down, but try reading or doing a quiet activity. When you feel sleepy, go back to bed.  Make sure that you drive carefully. Avoid driving if you feel very sleepy.  Keep all follow-up appointments as directed by your health care provider. This is important. SEEK MEDICAL CARE IF:   You are tired throughout the day or have trouble in your daily routine due to sleepiness.  You continue to have sleep problems  or your sleep problems get worse. SEEK IMMEDIATE MEDICAL CARE IF:   You have serious thoughts about hurting yourself or someone else.   This information is not intended to replace advice given to you by your health care provider. Make sure you discuss any  questions you have with your health care provider.   Document Released: 01/06/2000 Document Revised: 09/29/2014 Document Reviewed: 10/09/2013 Elsevier Interactive Patient Education [employer]

## 2015-03-22 ENCOUNTER — Emergency Department (HOSPITAL_BASED_OUTPATIENT_CLINIC_OR_DEPARTMENT_OTHER)
Admission: EM | Admit: 2015-03-22 | Discharge: 2015-03-23 | Disposition: A | Discharge status: Home / Self Care | Payer: Managed Care, Other (non HMO) | Attending: Emergency Medicine | Admitting: Emergency Medicine

## 2015-03-22 ENCOUNTER — Encounter (HOSPITAL_BASED_OUTPATIENT_CLINIC_OR_DEPARTMENT_OTHER): Payer: Self-pay | Admitting: *Deleted

## 2015-03-22 ENCOUNTER — Emergency Department (HOSPITAL_BASED_OUTPATIENT_CLINIC_OR_DEPARTMENT_OTHER): Payer: Managed Care, Other (non HMO)

## 2015-03-22 DIAGNOSIS — Z880 Allergy status to penicillin: Secondary | ICD-10-CM | POA: Insufficient documentation

## 2015-03-22 DIAGNOSIS — I10 Essential (primary) hypertension: Secondary | ICD-10-CM | POA: Diagnosis not present

## 2015-03-22 DIAGNOSIS — R21 Rash and other nonspecific skin eruption: Secondary | ICD-10-CM | POA: Diagnosis not present

## 2015-03-22 DIAGNOSIS — Z8739 Personal history of other diseases of the musculoskeletal system and connective tissue: Secondary | ICD-10-CM | POA: Insufficient documentation

## 2015-03-22 DIAGNOSIS — Z79899 Other long term (current) drug therapy: Secondary | ICD-10-CM | POA: Diagnosis not present

## 2015-03-22 DIAGNOSIS — E039 Hypothyroidism, unspecified: Secondary | ICD-10-CM | POA: Insufficient documentation

## 2015-03-22 DIAGNOSIS — F329 Major depressive disorder, single episode, unspecified: Secondary | ICD-10-CM | POA: Insufficient documentation

## 2015-03-22 DIAGNOSIS — R202 Paresthesia of skin: Secondary | ICD-10-CM | POA: Diagnosis not present

## 2015-03-22 DIAGNOSIS — R200 Anesthesia of skin: Secondary | ICD-10-CM | POA: Diagnosis present

## 2015-03-22 NOTE — ED Notes (Signed)
Pt placed on auto vitals Q30.  

## 2015-03-22 NOTE — ED Notes (Signed)
Numbness to the left side of her face yesterday. She was seen at [facility] for onset of HTN that was diagnosed a week ago. she had a negative cardiac workup yesterday. She got upset at work today and experienced the numbness in her face. She also has an unexplained rash on her arms, chest back and legs.

## 2015-03-23 MED ORDER — PREDNISONE 50 MG PO TABS: 50.0000 mg | ORAL_TABLET | Freq: Every day | ORAL | Status: DC

## 2015-03-23 NOTE — ED Provider Notes (Signed)
CSN: [id number]     Arrival date & time 03/22/15  1803 History   First MD Initiated Contact with Patient 03/22/15 2134     Chief Complaint  Patient presents with  . Numbness     (Consider location/radiation/quality/duration/timing/severity/associated sxs/prior Treatment) HPI Patient presents to the emergency department with left facial numbness and tingling that started after an argument with her son.  The patient states that she was somewhat upset after this discussion and noticed tingling in the left side of her face that has mostly resolved other than a small area on the left lower jaw line.  Patient states that she did not have any chest pain, shortness of breath, headache, blurred vision, slurred speech, weakness, dizziness, lightheadedness, abdominal pain, nausea, vomiting, diarrhea, neck pain, or syncope.  The patient states she was seen in the emergency department yesterday for some chest discomfort.  She states she was recently diagnosed with anxiety Past Medical History  Diagnosis Date  . Hypertension   . Arthritis   . Depression   . Hypothyroid    Past Surgical History  Procedure Laterality Date  . Nasal septoplasty w/ turbinoplasty    . Knee arthroscopy     No family history on file. Social History  Substance Use Topics  . Smoking status: Never Smoker   . Smokeless tobacco: None  . Alcohol Use: Yes   OB History    No data available     Review of Systems  All other systems negative except as documented in the HPI. All pertinent positives and negatives as reviewed in the HPI.  Allergies  Penicillins  Home Medications   Prior to Admission medications   Medication Sig Start Date End Date Taking? Authorizing Provider  ALPRAZolam (XANAX) 0.25 MG tablet Take 0.25 mg by mouth 2 (two) times daily as needed for anxiety.  03/14/15   Historical Provider, MD  atenolol (TENORMIN) 25 MG tablet Take 25 mg by mouth daily. 03/14/15   Historical Provider, MD  buPROPion  (WELLBUTRIN XL) 150 MG 24 hr tablet Take 150 mg by mouth daily. 03/14/15   Historical Provider, MD  diphenhydrAMINE (BENADRYL) 25 MG tablet Take 50 mg by mouth every 6 (six) hours as needed for itching or sleep.    Historical Provider, MD  inFLIXimab (REMICADE) 100 MG injection Inject 100 mg into the vein every 8 (eight) weeks.    Historical Provider, MD  levothyroxine (SYNTHROID, LEVOTHROID) 100 MCG tablet Take 100 mcg by mouth daily before breakfast.    Historical Provider, MD  omeprazole (PRILOSEC) 20 MG capsule Take 20 mg by mouth daily as needed (indigestion).    Historical Provider, MD  zolpidem (AMBIEN) 5 MG tablet Take 1 tablet (5 mg total) by mouth at bedtime as needed for sleep. 03/21/15   Cathren Laine, MD   BP 135/102 mmHg  Pulse 81  Temp(Src) 98.4 F (36.9 C) (Oral)  Resp 18  Ht  (1.626 m)  Wt 81.647 kg  BMI 30.88 kg/m2  SpO2 98%  LMP 02/24/2015 Physical Exam  Constitutional: She is oriented to person, place, and time. She appears well-developed and well-nourished. No distress.  HENT:  Head: Normocephalic and atraumatic.  Mouth/Throat: Oropharynx is clear and moist.  Eyes: Pupils are equal, round, and reactive to light.  Neck: Normal range of motion. Neck supple.  Cardiovascular: Normal rate, regular rhythm and normal heart sounds.  Exam reveals no gallop and no friction rub.   No murmur heard. Pulmonary/Chest: Effort normal and breath sounds normal. No  respiratory distress. She has no wheezes.  Abdominal: Soft. Bowel sounds are normal. She exhibits no distension. There is no tenderness.  Neurological: She is alert and oriented to person, place, and time. She has normal strength and normal reflexes. No cranial nerve deficit or sensory deficit. She exhibits normal muscle tone. Coordination and gait normal. GCS eye subscore is 4. GCS verbal subscore is 5. GCS motor subscore is 6.  Skin: Skin is warm and dry. Rash noted. No erythema.  Psychiatric: She has a normal mood and  affect. Her behavior is normal.  Nursing note and vitals reviewed.   ED Course  Procedures (including critical care time) Labs Review Labs Reviewed - No data to display  Imaging Review Dg Chest 2 View  03/21/2015  CLINICAL DATA:  LEFT side pain into back and chest since 0300 hours today, shortness of breath, dizziness, nausea, history hypertension EXAM: CHEST  2 VIEW COMPARISON:  07/08/2013 FINDINGS: Normal heart size, mediastinal contours, and pulmonary vascularity. Lungs clear. No pulmonary infiltrate, pleural effusion, or pneumothorax. Bones unremarkable. IMPRESSION: Normal exam. Electronically Signed   By: Ulyses Southward M.D.   On: 03/21/2015 13:47   Ct Head Wo Contrast  03/22/2015  CLINICAL DATA:  LEFT facial and head numbness for 36 hours beginning at work. History of hypertension. EXAM: CT HEAD WITHOUT CONTRAST TECHNIQUE: Contiguous axial images were obtained from the base of the skull through the vertex without intravenous contrast. COMPARISON:  None. FINDINGS: The ventricles and sulci are normal. No intraparenchymal hemorrhage, mass effect nor midline shift. No acute large vascular territory infarcts. No abnormal extra-axial fluid collections. Basal cisterns are patent. No skull fracture. The included ocular globes and orbital contents are non-suspicious. The mastoid aircells and included paranasal sinuses are well-aerated. IMPRESSION: Normal noncontrast CT head. Electronically Signed   By: Awilda Metro M.D.   On: 03/22/2015 23:50   I have personally reviewed and evaluated these images and lab results as part of my medical decision-making.  Patient has no neurological deficits noted on exam.  She has a small area of tingling in a very finite region of the jaw line.  She does not have any signs of Bell's palsy at this time.  She was concerned about this.  She does have a rash as been present for 3 weeks.  We will give her treatment with prednisone.  Told to follow with her primary care  doctor    Charlestine Night, PA-C 03/23/15 0114  Laurence Spates, MD 03/23/15 [phone]

## 2015-03-23 NOTE — Discharge Instructions (Signed)
Your CT scan did not show any abnormality.  Follow-up with your primary care doctor.  I reviewed your testing from yesterday

## 2015-04-01 ENCOUNTER — Other Ambulatory Visit: Payer: Self-pay

## 2015-04-01 DIAGNOSIS — Z1231 Encounter for screening mammogram for malignant neoplasm of breast: Secondary | ICD-10-CM

## 2015-04-13 ENCOUNTER — Ambulatory Visit: Payer: Managed Care, Other (non HMO)

## 2015-04-22 ENCOUNTER — Ambulatory Visit
Admission: RE | Admit: 2015-04-22 | Discharge: 2015-04-22 | Disposition: A | Discharge status: Home / Self Care | Payer: Managed Care, Other (non HMO) | Source: Ambulatory Visit

## 2015-04-22 DIAGNOSIS — Z1231 Encounter for screening mammogram for malignant neoplasm of breast: Secondary | ICD-10-CM

## 2015-05-13 ENCOUNTER — Other Ambulatory Visit: Payer: Self-pay | Admitting: Internal Medicine

## 2015-05-13 ENCOUNTER — Other Ambulatory Visit (HOSPITAL_COMMUNITY)
Admission: RE | Admit: 2015-05-13 | Discharge: 2015-05-13 | Disposition: A | Discharge status: Home / Self Care | Payer: Managed Care, Other (non HMO) | Source: Ambulatory Visit | Attending: Internal Medicine | Admitting: Internal Medicine

## 2015-05-13 DIAGNOSIS — Z01419 Encounter for gynecological examination (general) (routine) without abnormal findings: Secondary | ICD-10-CM | POA: Insufficient documentation

## 2015-05-17 LAB — CYTOLOGY - PAP

## 2015-08-03 DIAGNOSIS — Z79899 Other long term (current) drug therapy: Secondary | ICD-10-CM | POA: Diagnosis not present

## 2015-08-03 DIAGNOSIS — L4050 Arthropathic psoriasis, unspecified: Secondary | ICD-10-CM | POA: Diagnosis not present

## 2015-08-05 DIAGNOSIS — L4050 Arthropathic psoriasis, unspecified: Secondary | ICD-10-CM | POA: Diagnosis not present

## 2015-08-05 DIAGNOSIS — Z79899 Other long term (current) drug therapy: Secondary | ICD-10-CM | POA: Diagnosis not present

## 2015-09-30 DIAGNOSIS — L4050 Arthropathic psoriasis, unspecified: Secondary | ICD-10-CM | POA: Diagnosis not present

## 2015-11-04 DIAGNOSIS — M25512 Pain in left shoulder: Secondary | ICD-10-CM | POA: Diagnosis not present

## 2015-11-04 DIAGNOSIS — L4050 Arthropathic psoriasis, unspecified: Secondary | ICD-10-CM | POA: Diagnosis not present

## 2015-11-04 DIAGNOSIS — Z79899 Other long term (current) drug therapy: Secondary | ICD-10-CM | POA: Diagnosis not present

## 2015-11-22 DIAGNOSIS — L4050 Arthropathic psoriasis, unspecified: Secondary | ICD-10-CM | POA: Diagnosis not present

## 2015-12-28 DIAGNOSIS — M25562 Pain in left knee: Secondary | ICD-10-CM | POA: Diagnosis not present

## 2015-12-30 DIAGNOSIS — Z23 Encounter for immunization: Secondary | ICD-10-CM | POA: Diagnosis not present

## 2016-01-10 DIAGNOSIS — M25562 Pain in left knee: Secondary | ICD-10-CM | POA: Diagnosis not present

## 2016-01-10 DIAGNOSIS — M6281 Muscle weakness (generalized): Secondary | ICD-10-CM | POA: Diagnosis not present

## 2016-01-12 DIAGNOSIS — M25562 Pain in left knee: Secondary | ICD-10-CM | POA: Diagnosis not present

## 2016-01-12 DIAGNOSIS — M6281 Muscle weakness (generalized): Secondary | ICD-10-CM | POA: Diagnosis not present

## 2016-01-18 DIAGNOSIS — J0100 Acute maxillary sinusitis, unspecified: Secondary | ICD-10-CM | POA: Diagnosis not present

## 2016-01-18 DIAGNOSIS — J020 Streptococcal pharyngitis: Secondary | ICD-10-CM | POA: Diagnosis not present

## 2016-01-20 DIAGNOSIS — M25562 Pain in left knee: Secondary | ICD-10-CM | POA: Diagnosis not present

## 2016-01-20 DIAGNOSIS — M6281 Muscle weakness (generalized): Secondary | ICD-10-CM | POA: Diagnosis not present

## 2016-01-24 DIAGNOSIS — M6281 Muscle weakness (generalized): Secondary | ICD-10-CM | POA: Diagnosis not present

## 2016-01-24 DIAGNOSIS — M25562 Pain in left knee: Secondary | ICD-10-CM | POA: Diagnosis not present

## 2016-01-27 DIAGNOSIS — M6281 Muscle weakness (generalized): Secondary | ICD-10-CM | POA: Diagnosis not present

## 2016-01-27 DIAGNOSIS — M25562 Pain in left knee: Secondary | ICD-10-CM | POA: Diagnosis not present

## 2016-01-31 DIAGNOSIS — M25562 Pain in left knee: Secondary | ICD-10-CM | POA: Diagnosis not present

## 2016-01-31 DIAGNOSIS — M6281 Muscle weakness (generalized): Secondary | ICD-10-CM | POA: Diagnosis not present

## 2016-02-06 DIAGNOSIS — M549 Dorsalgia, unspecified: Secondary | ICD-10-CM | POA: Diagnosis not present

## 2016-02-06 DIAGNOSIS — Z79899 Other long term (current) drug therapy: Secondary | ICD-10-CM | POA: Diagnosis not present

## 2016-02-06 DIAGNOSIS — L4050 Arthropathic psoriasis, unspecified: Secondary | ICD-10-CM | POA: Diagnosis not present

## 2016-02-07 DIAGNOSIS — M6281 Muscle weakness (generalized): Secondary | ICD-10-CM | POA: Diagnosis not present

## 2016-02-07 DIAGNOSIS — M25562 Pain in left knee: Secondary | ICD-10-CM | POA: Diagnosis not present

## 2016-04-06 DIAGNOSIS — L4050 Arthropathic psoriasis, unspecified: Secondary | ICD-10-CM | POA: Diagnosis not present

## 2016-04-12 DIAGNOSIS — R51 Headache: Secondary | ICD-10-CM | POA: Diagnosis not present

## 2016-04-12 DIAGNOSIS — J029 Acute pharyngitis, unspecified: Secondary | ICD-10-CM | POA: Diagnosis not present

## 2016-04-12 DIAGNOSIS — I10 Essential (primary) hypertension: Secondary | ICD-10-CM | POA: Diagnosis not present

## 2016-04-26 DIAGNOSIS — I10 Essential (primary) hypertension: Secondary | ICD-10-CM | POA: Diagnosis not present

## 2016-05-08 DIAGNOSIS — Z79899 Other long term (current) drug therapy: Secondary | ICD-10-CM | POA: Diagnosis not present

## 2016-05-08 DIAGNOSIS — L4050 Arthropathic psoriasis, unspecified: Secondary | ICD-10-CM | POA: Diagnosis not present

## 2016-06-01 DIAGNOSIS — L4050 Arthropathic psoriasis, unspecified: Secondary | ICD-10-CM | POA: Diagnosis not present

## 2016-07-09 DIAGNOSIS — L4050 Arthropathic psoriasis, unspecified: Secondary | ICD-10-CM | POA: Diagnosis not present

## 2016-07-09 DIAGNOSIS — M25539 Pain in unspecified wrist: Secondary | ICD-10-CM | POA: Diagnosis not present

## 2016-07-09 DIAGNOSIS — M79673 Pain in unspecified foot: Secondary | ICD-10-CM | POA: Diagnosis not present

## 2016-07-09 DIAGNOSIS — M79643 Pain in unspecified hand: Secondary | ICD-10-CM | POA: Diagnosis not present

## 2016-07-09 DIAGNOSIS — Z79899 Other long term (current) drug therapy: Secondary | ICD-10-CM | POA: Diagnosis not present

## 2016-08-03 DIAGNOSIS — L4050 Arthropathic psoriasis, unspecified: Secondary | ICD-10-CM | POA: Diagnosis not present

## 2016-08-03 DIAGNOSIS — Z79899 Other long term (current) drug therapy: Secondary | ICD-10-CM | POA: Diagnosis not present

## 2016-09-02 ENCOUNTER — Emergency Department (HOSPITAL_COMMUNITY)
Admission: EM | Admit: 2016-09-02 | Discharge: 2016-09-02 | Disposition: A | Discharge status: Home / Self Care | Payer: BLUE CROSS/BLUE SHIELD | Attending: Emergency Medicine | Admitting: Emergency Medicine

## 2016-09-02 ENCOUNTER — Encounter (HOSPITAL_COMMUNITY): Payer: Self-pay | Admitting: Emergency Medicine

## 2016-09-02 ENCOUNTER — Emergency Department (HOSPITAL_COMMUNITY): Payer: BLUE CROSS/BLUE SHIELD

## 2016-09-02 DIAGNOSIS — Z79899 Other long term (current) drug therapy: Secondary | ICD-10-CM | POA: Insufficient documentation

## 2016-09-02 DIAGNOSIS — I10 Essential (primary) hypertension: Secondary | ICD-10-CM | POA: Insufficient documentation

## 2016-09-02 DIAGNOSIS — R42 Dizziness and giddiness: Secondary | ICD-10-CM | POA: Diagnosis not present

## 2016-09-02 DIAGNOSIS — E039 Hypothyroidism, unspecified: Secondary | ICD-10-CM | POA: Diagnosis not present

## 2016-09-02 DIAGNOSIS — R51 Headache: Secondary | ICD-10-CM | POA: Diagnosis not present

## 2016-09-02 DIAGNOSIS — R197 Diarrhea, unspecified: Secondary | ICD-10-CM | POA: Diagnosis not present

## 2016-09-02 LAB — COMPREHENSIVE METABOLIC PANEL
ALBUMIN: 3.9 g/dL (ref 3.5–5.0)
ALK PHOS: 49 U/L (ref 38–126)
ALT: 21 U/L (ref 14–54)
ANION GAP: 11 (ref 5–15)
AST: 26 U/L (ref 15–41)
BUN: 12 mg/dL (ref 6–20)
CALCIUM: 9.2 mg/dL (ref 8.9–10.3)
CHLORIDE: 101 mmol/L (ref 101–111)
CO2: 23 mmol/L (ref 22–32)
Creatinine, Ser: 0.72 mg/dL (ref 0.44–1.00)
GFR calc Af Amer: >60 mL/min (ref >60)
GFR calc non Af Amer: >60 mL/min (ref >60)
GLUCOSE: 111 mg/dL — AB (ref 65–99)
POTASSIUM: 3.7 mmol/L (ref 3.5–5.1)
SODIUM: 135 mmol/L (ref 135–145)
Total Bilirubin: 0.5 mg/dL (ref 0.3–1.2)
Total Protein: 7.4 g/dL (ref 6.5–8.1)

## 2016-09-02 LAB — CBC
HEMATOCRIT: 38 % (ref 36.0–46.0)
HEMOGLOBIN: 12.1 g/dL (ref 12.0–15.0)
MCH: 29.7 pg (ref 26.0–34.0)
MCHC: 31.8 g/dL (ref 30.0–36.0)
MCV: 93.1 fL (ref 78.0–100.0)
Platelets: 454 10*3/uL — ABNORMAL HIGH (ref 150–400)
RBC: 4.08 MIL/uL (ref 3.87–5.11)
RDW: 15.8 % — ABNORMAL HIGH (ref 11.5–15.5)
WBC: 14.3 10*3/uL — ABNORMAL HIGH (ref 4.0–10.5)

## 2016-09-02 LAB — POC OCCULT BLOOD, ED: FECAL OCCULT BLD: POSITIVE — AB

## 2016-09-02 LAB — LIPASE, BLOOD: LIPASE: 37 U/L (ref 11–51)

## 2016-09-02 LAB — URINALYSIS, ROUTINE W REFLEX MICROSCOPIC
Bacteria, UA: NONE SEEN
Bilirubin Urine: NEGATIVE
Glucose, UA: NEGATIVE mg/dL
KETONES UR: NEGATIVE mg/dL
Leukocytes, UA: NEGATIVE
Nitrite: NEGATIVE
PH: 6 (ref 5.0–8.0)
Protein, ur: NEGATIVE mg/dL
RBC / HPF: NONE SEEN RBC/hpf (ref 0–5)
Specific Gravity, Urine: 1.019 (ref 1.005–1.030)
WBC, UA: NONE SEEN WBC/hpf (ref 0–5)

## 2016-09-02 LAB — I-STAT BETA HCG BLOOD, ED (MC, WL, AP ONLY): I-stat hCG, quantitative: <5 m[IU]/mL (ref <5)

## 2016-09-02 LAB — I-STAT TROPONIN, ED: Troponin i, poc: 0 ng/mL (ref 0.00–0.08)

## 2016-09-02 MED ORDER — SODIUM CHLORIDE 0.9 % IV BOLUS (SEPSIS)
1000.0000 mL | Freq: Once | INTRAVENOUS | Status: AC
Start: 2016-09-02 — End: 2016-09-02
  Administered 2016-09-02: 1000 mL via INTRAVENOUS

## 2016-09-02 MED ORDER — LORAZEPAM 2 MG/ML IJ SOLN
1.0000 mg | Freq: Once | INTRAMUSCULAR | Status: AC
  Administered 2016-09-02: 1 mg via INTRAVENOUS
  Filled 2016-09-02: qty 1

## 2016-09-02 MED ORDER — SODIUM CHLORIDE 0.9 % IV SOLN
1000.0000 mL | INTRAVENOUS | Status: DC
Start: 2016-09-02 — End: 2016-09-02
  Administered 2016-09-02: 1000 mL via INTRAVENOUS

## 2016-09-02 NOTE — ED Provider Notes (Signed)
[facility]-EMERGENCY DEPT Provider Note   CSN: [id number] Arrival date & time: 09/02/16  4098     History   Chief Complaint Chief Complaint  Patient presents with  . Diarrhea  . Dizziness  . Nausea  . Headache    HPI Chelsey Franco is a 46 y.o. female presenting with one-day history of diarrhea, and intermittent vision changes.  Patient states that she started to have diarrhea at 4:00 this morning. She has had about 10 episodes of loose stools, and is unsure if they have been bloody. Additionally, when she woke up this morning, she reports 2 episodes of spontaneously resolving vision changes, in which she felt she couldn't see well because things were spinning. She denies feeling like the room is spinning, and describes it as being 'more than feeling off-balance'. She denies feeling presyncopal or lightheaded. These episodes lasted for a few minutes before the results spontaneously. She was not going from sitting to standing at the time. Additionally, she has had one episode of vomiting this morning, is nonbloody and nonbilious. At that time, she felt shaky, weak, and sweaty. She denies chest pain or shortness of breath. Patient reports that for the past week, her stomach has felt unsettled. It has felt more bloated and gassy than normal. She has had intermittent loose stools. Her appetite has been normal and she has not vomited until today. She denies abdominal pain, fever, chills, sore throat, or urinary symptoms. She denies recent medicine changes, but states that she was switched from oral methotrexate to infliximab injections last month. She gets weekly injections on Fridays. She has had no issues with the injection so far. Currently she is reporting only abdominal cramping, and states all other symptoms have resolved. She denies nausea, pain, or vision changes.  She denies a history of abdominal surgeries.   HPI  Past Medical History:  Diagnosis Date  . Arthritis   . Depression   .  Hypertension   . Hypothyroid     There are no active problems to display for this patient.   Past Surgical History:  Procedure Laterality Date  . KNEE ARTHROSCOPY    . NASAL SEPTOPLASTY W/ TURBINOPLASTY      OB History    No data available       Home Medications    Prior to Admission medications   Medication Sig Start Date End Date Taking? Authorizing Provider  ALPRAZolam (XANAX) 0.25 MG tablet Take 0.25 mg by mouth 2 (two) times daily as needed for anxiety.  03/14/15   [provider]  atenolol (TENORMIN) 25 MG tablet Take 25 mg by mouth daily. 03/14/15   [provider]  buPROPion (WELLBUTRIN XL) 150 MG 24 hr tablet Take 150 mg by mouth daily. 03/14/15   [provider]  diphenhydrAMINE (BENADRYL) 25 MG tablet Take 50 mg by mouth every 6 (six) hours as needed for itching or sleep.    [provider]  inFLIXimab (REMICADE) 100 MG injection Inject 100 mg into the vein every 8 (eight) weeks.    [provider]  levothyroxine (SYNTHROID, LEVOTHROID) 100 MCG tablet Take 100 mcg by mouth daily before breakfast.    [provider]  omeprazole (PRILOSEC) 20 MG capsule Take 20 mg by mouth daily as needed (indigestion).    [provider]  predniSONE (DELTASONE) 50 MG tablet Take 1 tablet (50 mg total) by mouth daily with breakfast. 03/23/15   Lawyer, Cristal Deer, PA-C  zolpidem (AMBIEN) 5 MG tablet Take 1 tablet (5  mg total) by mouth at bedtime as needed for sleep. 03/21/15   Cathren Laine, MD    Family History No family history on file.  Social History Social History  Substance Use Topics  . Smoking status: Never Smoker  . Smokeless tobacco: Not on file  . Alcohol use Yes     Allergies   Penicillins   Review of Systems Review of Systems  Eyes: Positive for visual disturbance.  Gastrointestinal: Positive for diarrhea, nausea and vomiting.  Allergic/Immunologic: Positive for immunocompromised state.  Neurological:  Positive for dizziness.  All other systems reviewed and are negative.    Physical Exam Updated Vital Signs BP (!) 148/80   Pulse 87   Temp 98.3 F (36.8 C)   Resp 18   Ht 5' 4.25" (1.632 m)   Wt 86.2 kg (190 lb)   LMP 09/02/2016   SpO2 99%   BMI 32.36 kg/m   Physical Exam  Constitutional: She is oriented to person, place, and time. She appears well-developed and well-nourished. No distress.  HENT:  Head: Normocephalic and atraumatic.  Right Ear: Tympanic membrane, external ear and ear canal normal.  Left Ear: Tympanic membrane, external ear and ear canal normal.  Nose: Nose normal.  Mouth/Throat: Uvula is midline, oropharynx is clear and moist and mucous membranes are normal.  Eyes: Pupils are equal, round, and reactive to light. Conjunctivae, EOM and lids are normal.  No nystagmus.  Neck: Normal range of motion. Neck supple.  Cardiovascular: Normal rate, regular rhythm and intact distal pulses.   Pulmonary/Chest: Effort normal and breath sounds normal. No respiratory distress. She has no wheezes.  Abdominal: Soft. Normal appearance and bowel sounds are normal. She exhibits no distension and no mass. There is no tenderness. There is no rigidity, no rebound, no guarding, no CVA tenderness, no tenderness at McBurney's point and negative Murphy's sign.  No tenderness to palpation of the abdomen. No obvious distention, or rigidity. Normoactive bowel sounds 4.  Genitourinary: Rectal exam shows no external hemorrhoid, no internal hemorrhoid, no mass and anal tone normal.  Genitourinary Comments: No gross blood seen on rectal exam.  Musculoskeletal: Normal range of motion.  Lymphadenopathy:    She has no cervical adenopathy.  Neurological: She is alert and oriented to person, place, and time. She has normal strength. No cranial nerve deficit. She displays a negative Romberg sign. GCS eye subscore is 4. GCS verbal subscore is 5. GCS motor subscore is 6.  Fine movement and  coordination intact. No obvious neurologic deficit.  Skin: Skin is warm and dry. She is not diaphoretic.  Psychiatric: She has a normal mood and affect.  Nursing note and vitals reviewed.    ED Treatments / Results  Labs (all labs ordered are listed, but only abnormal results are displayed) Labs Reviewed  URINALYSIS, ROUTINE W REFLEX MICROSCOPIC - Abnormal; Notable for the following:       Result Value   Hgb urine dipstick MODERATE (*)    Squamous Epithelial / LPF 0-5 (*)    All other components within normal limits  COMPREHENSIVE METABOLIC PANEL - Abnormal; Notable for the following:    Glucose, Bld 111 (*)    All other components within normal limits  CBC - Abnormal; Notable for the following:    WBC 14.3 (*)    RDW 15.8 (*)    Platelets 454 (*)    All other components within normal limits  POC OCCULT BLOOD, ED - Abnormal; Notable for the following:    Fecal  Occult Bld POSITIVE (*)    All other components within normal limits  LIPASE, BLOOD  CBG MONITORING, ED  I-STAT BETA HCG BLOOD, ED ([facility], [facility], [facility] ONLY)  I-STAT TROPONIN, ED    EKG  EKG Interpretation  Date/Time:  Sunday September 02 2016 10:19:37 EDT Ventricular Rate:  83 PR Interval:  134 QRS Duration: 88 QT Interval:  392 QTC Calculation: 460 R Axis:   45 Text Interpretation:  Normal sinus rhythm Nonspecific T wave abnormality Prolonged QT Abnormal ECG Since last tracing rate slower Confirmed by Doug SouJacubowitz, Sam [phone]([id number]) on 09/02/2016 10:28:06 AM       Radiology Mr Brain Wo Contrast  Result Date: 09/02/2016 CLINICAL DATA:  Headache and dizziness EXAM: MRI HEAD WITHOUT CONTRAST TECHNIQUE: Multiplanar, multiecho pulse sequences of the brain and surrounding structures were obtained without intravenous contrast. COMPARISON:  CT head 03/22/2015 FINDINGS: Brain: Ventricle size and cerebral volume normal. Negative for acute infarct. 2 small hyperintensities in the right cerebral white matter involving the right parietal  periventricular white matter and right frontal subcortical white matter. Brainstem and cerebellum normal. Negative for hemorrhage or mass Vascular: Normal arterial flow void Skull and upper cervical spine: Negative Sinuses/Orbits: Negative Other: None IMPRESSION: No acute abnormality. Minimal chronic white matter changes on the right. Electronically Signed   By: Marlan Palauharles  Clark M.D.   On: 09/02/2016 13:46    Procedures Procedures (including critical care time)  Medications Ordered in ED Medications  sodium chloride 0.9 % bolus 1,000 mL (0 mLs Intravenous Stopped 09/02/16 1155)  LORazepam (ATIVAN) injection 1 mg (1 mg Intravenous Given 09/02/16 1155)     Initial Impression / Assessment and Plan / ED Course  I have reviewed the triage vital signs and the nursing notes.  Pertinent labs & imaging results that were available during my care of the patient were reviewed by me and considered in my medical decision making (see chart for details).     Patient presenting with less than 10 hours of diarrhea and 2 episodes of symptoms of spinning. Patient appears nontoxic, and physical exam reassuring as vitals are stable, no fever, no abdominal pain, and she does not appear overtly dehydrated. No obvious neurologic deficits on exam. Rectal exam without gross blood. Will order basic abdominal labs and UA. while I doubt cardiac event, will order EKG and troponin for complete evaluation. Discussed case with attending, and Dr. Rennis ChrisJacobowitz evaluated the patient. Will order MRI without contrast for further evaluation and rule out intracerebral pathology. Will start IV fluids.  Labs reassuring. Elevated white count likely due to stress. Positive occult blood likely due to diarrhea. UA reassuring. Doubt peritonitis, obstruction, perforation, or acute intraabdomial infection. MRI negative for acute findings. Doubt cardiac or cerebral cause of symptoms. EKG reassuring. Patient without recurrence of symptoms and  emergency department. Symptoms likely related to blood pressure and hydration status. Patient reports that her diarrhea symptoms have been improving over the course of this morning. Likely self-limiting illness. Patient appears safe for discharge. Return precautions given. Patient to follow-up with primary care. Patient states she understands and agrees to plan.   Final Clinical Impressions(s) / ED Diagnoses   Final diagnoses:  Diarrhea, unspecified type  Dizziness    New Prescriptions Discharge Medication List as of 09/02/2016  2:07 PM       Alveria ApleyCaccavale, Margot Oriordan, PA-C 09/02/16 2045    Doug SouJacubowitz, Sam, MD 09/03/16 1344

## 2016-09-02 NOTE — ED Notes (Signed)
Patient transported to MRI 

## 2016-09-02 NOTE — ED Triage Notes (Signed)
Pt states "i was up sick last night with diarrhea, and I woke up at 830am this morning and still having diarrhea and nausea and I felt like when I looked at stuff it was spinning. I threw up once. I have pain in the left side of my head, i've had a headache since Saturday."

## 2016-09-02 NOTE — Discharge Instructions (Signed)
It is important that you stay well-hydrated. Your symptoms will likely resolve over the next several days. It is important that you follow-up with your primary care doctor for further evaluation of your symptoms and your blood pressure. Return to the emergency department if he develops persistent high fevers, persistent vomiting, or any new or worsening symptoms.

## 2016-09-02 NOTE — ED Triage Notes (Signed)
Pt denies any room spinning sensation at this time, pain 4/10 in head. Ambulatory with steady gait.

## 2016-09-02 NOTE — ED Provider Notes (Addendum)
Complains of diarrhea and abdominal cramping since approximately 4 AM today (10 episodes of diarrhea). She feels abdominal cramping has improved with time. She also had 2 episodes of room spinning lasting 5 minutes or less which resolve spontaneously. On exam she is in no distress Glasgow Coma Score 15 HEENT exam normocephalic atraumatic no facial asymmetry neck supple neurologic Glasgow Coma Score 15 cranial nerves II through XII grossly intact gait normal Romberg normal pronator drift normal finger to nose normal   Doug SouJacubowitz, Znya Albino, MD 09/02/16 1716    Doug SouJacubowitz, Patrecia Veiga, MD 09/03/16 1343

## 2016-09-10 DIAGNOSIS — Z79899 Other long term (current) drug therapy: Secondary | ICD-10-CM | POA: Diagnosis not present

## 2016-09-10 DIAGNOSIS — L4050 Arthropathic psoriasis, unspecified: Secondary | ICD-10-CM | POA: Diagnosis not present

## 2016-09-10 DIAGNOSIS — M79643 Pain in unspecified hand: Secondary | ICD-10-CM | POA: Diagnosis not present

## 2016-09-10 DIAGNOSIS — M25539 Pain in unspecified wrist: Secondary | ICD-10-CM | POA: Diagnosis not present

## 2016-09-28 DIAGNOSIS — L4050 Arthropathic psoriasis, unspecified: Secondary | ICD-10-CM | POA: Diagnosis not present

## 2016-10-29 DIAGNOSIS — I10 Essential (primary) hypertension: Secondary | ICD-10-CM | POA: Diagnosis not present

## 2016-10-29 DIAGNOSIS — Z23 Encounter for immunization: Secondary | ICD-10-CM | POA: Diagnosis not present

## 2016-11-14 DIAGNOSIS — R0989 Other specified symptoms and signs involving the circulatory and respiratory systems: Secondary | ICD-10-CM | POA: Diagnosis not present

## 2016-11-14 DIAGNOSIS — R05 Cough: Secondary | ICD-10-CM | POA: Diagnosis not present

## 2016-11-23 ENCOUNTER — Ambulatory Visit
Admission: RE | Admit: 2016-11-23 | Discharge: 2016-11-23 | Disposition: A | Discharge status: Home / Self Care | Payer: BLUE CROSS/BLUE SHIELD | Source: Ambulatory Visit | Attending: Internal Medicine | Admitting: Internal Medicine

## 2016-11-23 ENCOUNTER — Other Ambulatory Visit: Payer: Self-pay | Admitting: Internal Medicine

## 2016-11-23 DIAGNOSIS — J203 Acute bronchitis due to coxsackievirus: Secondary | ICD-10-CM | POA: Diagnosis not present

## 2016-11-23 DIAGNOSIS — R079 Chest pain, unspecified: Secondary | ICD-10-CM | POA: Diagnosis not present

## 2016-11-23 DIAGNOSIS — R0602 Shortness of breath: Secondary | ICD-10-CM | POA: Diagnosis not present

## 2016-11-23 DIAGNOSIS — R05 Cough: Secondary | ICD-10-CM | POA: Diagnosis not present

## 2016-11-30 DIAGNOSIS — R194 Change in bowel habit: Secondary | ICD-10-CM | POA: Diagnosis not present

## 2016-11-30 DIAGNOSIS — D649 Anemia, unspecified: Secondary | ICD-10-CM | POA: Diagnosis not present

## 2016-11-30 DIAGNOSIS — R0609 Other forms of dyspnea: Secondary | ICD-10-CM | POA: Diagnosis not present

## 2016-12-10 DIAGNOSIS — L4050 Arthropathic psoriasis, unspecified: Secondary | ICD-10-CM | POA: Diagnosis not present

## 2016-12-10 DIAGNOSIS — Z79899 Other long term (current) drug therapy: Secondary | ICD-10-CM | POA: Diagnosis not present

## 2016-12-10 DIAGNOSIS — M79643 Pain in unspecified hand: Secondary | ICD-10-CM | POA: Diagnosis not present

## 2016-12-10 DIAGNOSIS — M25539 Pain in unspecified wrist: Secondary | ICD-10-CM | POA: Diagnosis not present

## 2016-12-26 DIAGNOSIS — J329 Chronic sinusitis, unspecified: Secondary | ICD-10-CM | POA: Diagnosis not present

## 2016-12-26 DIAGNOSIS — J410 Simple chronic bronchitis: Secondary | ICD-10-CM | POA: Diagnosis not present

## 2016-12-26 DIAGNOSIS — I10 Essential (primary) hypertension: Secondary | ICD-10-CM | POA: Diagnosis not present

## 2016-12-28 DIAGNOSIS — R194 Change in bowel habit: Secondary | ICD-10-CM | POA: Diagnosis not present

## 2016-12-28 DIAGNOSIS — R140 Abdominal distension (gaseous): Secondary | ICD-10-CM | POA: Diagnosis not present

## 2016-12-28 DIAGNOSIS — D509 Iron deficiency anemia, unspecified: Secondary | ICD-10-CM | POA: Diagnosis not present

## 2016-12-28 DIAGNOSIS — R6881 Early satiety: Secondary | ICD-10-CM | POA: Diagnosis not present

## 2017-02-05 DIAGNOSIS — L4050 Arthropathic psoriasis, unspecified: Secondary | ICD-10-CM | POA: Diagnosis not present

## 2017-02-08 DIAGNOSIS — K259 Gastric ulcer, unspecified as acute or chronic, without hemorrhage or perforation: Secondary | ICD-10-CM | POA: Diagnosis not present

## 2017-02-08 DIAGNOSIS — R6881 Early satiety: Secondary | ICD-10-CM | POA: Diagnosis not present

## 2017-02-08 DIAGNOSIS — D509 Iron deficiency anemia, unspecified: Secondary | ICD-10-CM | POA: Diagnosis not present

## 2017-02-08 DIAGNOSIS — K219 Gastro-esophageal reflux disease without esophagitis: Secondary | ICD-10-CM | POA: Diagnosis not present

## 2017-02-08 DIAGNOSIS — K210 Gastro-esophageal reflux disease with esophagitis: Secondary | ICD-10-CM | POA: Diagnosis not present

## 2017-02-08 DIAGNOSIS — R194 Change in bowel habit: Secondary | ICD-10-CM | POA: Diagnosis not present

## 2017-02-08 DIAGNOSIS — K2930 Chronic superficial gastritis without bleeding: Secondary | ICD-10-CM | POA: Diagnosis not present

## 2017-02-08 DIAGNOSIS — R140 Abdominal distension (gaseous): Secondary | ICD-10-CM | POA: Diagnosis not present

## 2017-02-08 DIAGNOSIS — K635 Polyp of colon: Secondary | ICD-10-CM | POA: Diagnosis not present

## 2017-02-08 DIAGNOSIS — K2980 Duodenitis without bleeding: Secondary | ICD-10-CM | POA: Diagnosis not present

## 2017-02-08 DIAGNOSIS — R195 Other fecal abnormalities: Secondary | ICD-10-CM | POA: Diagnosis not present

## 2017-02-08 DIAGNOSIS — K649 Unspecified hemorrhoids: Secondary | ICD-10-CM | POA: Diagnosis not present

## 2017-02-08 DIAGNOSIS — K2210 Ulcer of esophagus without bleeding: Secondary | ICD-10-CM | POA: Diagnosis not present

## 2017-02-08 DIAGNOSIS — K5730 Diverticulosis of large intestine without perforation or abscess without bleeding: Secondary | ICD-10-CM | POA: Diagnosis not present

## 2017-02-14 DIAGNOSIS — K219 Gastro-esophageal reflux disease without esophagitis: Secondary | ICD-10-CM | POA: Diagnosis not present

## 2017-02-14 DIAGNOSIS — K210 Gastro-esophageal reflux disease with esophagitis: Secondary | ICD-10-CM | POA: Diagnosis not present

## 2017-02-14 DIAGNOSIS — K635 Polyp of colon: Secondary | ICD-10-CM | POA: Diagnosis not present

## 2017-02-14 DIAGNOSIS — K2980 Duodenitis without bleeding: Secondary | ICD-10-CM | POA: Diagnosis not present

## 2017-03-12 DIAGNOSIS — M25539 Pain in unspecified wrist: Secondary | ICD-10-CM | POA: Diagnosis not present

## 2017-03-12 DIAGNOSIS — L4050 Arthropathic psoriasis, unspecified: Secondary | ICD-10-CM | POA: Diagnosis not present

## 2017-03-12 DIAGNOSIS — M79643 Pain in unspecified hand: Secondary | ICD-10-CM | POA: Diagnosis not present

## 2017-03-12 DIAGNOSIS — Z79899 Other long term (current) drug therapy: Secondary | ICD-10-CM | POA: Diagnosis not present

## 2017-04-05 DIAGNOSIS — M4050 Lordosis, unspecified, site unspecified: Secondary | ICD-10-CM | POA: Diagnosis not present

## 2017-04-22 DIAGNOSIS — I10 Essential (primary) hypertension: Secondary | ICD-10-CM | POA: Diagnosis not present

## 2017-04-26 DIAGNOSIS — M79643 Pain in unspecified hand: Secondary | ICD-10-CM | POA: Diagnosis not present

## 2017-04-26 DIAGNOSIS — Z79899 Other long term (current) drug therapy: Secondary | ICD-10-CM | POA: Diagnosis not present

## 2017-04-26 DIAGNOSIS — M25431 Effusion, right wrist: Secondary | ICD-10-CM | POA: Diagnosis not present

## 2017-04-26 DIAGNOSIS — M25531 Pain in right wrist: Secondary | ICD-10-CM | POA: Diagnosis not present

## 2017-04-26 DIAGNOSIS — L4050 Arthropathic psoriasis, unspecified: Secondary | ICD-10-CM | POA: Diagnosis not present

## 2017-05-12 DIAGNOSIS — H66001 Acute suppurative otitis media without spontaneous rupture of ear drum, right ear: Secondary | ICD-10-CM | POA: Diagnosis not present

## 2017-05-31 DIAGNOSIS — Z79899 Other long term (current) drug therapy: Secondary | ICD-10-CM | POA: Diagnosis not present

## 2017-05-31 DIAGNOSIS — M25539 Pain in unspecified wrist: Secondary | ICD-10-CM | POA: Diagnosis not present

## 2017-05-31 DIAGNOSIS — L4050 Arthropathic psoriasis, unspecified: Secondary | ICD-10-CM | POA: Diagnosis not present

## 2017-05-31 DIAGNOSIS — M79643 Pain in unspecified hand: Secondary | ICD-10-CM | POA: Diagnosis not present

## 2017-06-05 DIAGNOSIS — I10 Essential (primary) hypertension: Secondary | ICD-10-CM | POA: Diagnosis not present

## 2017-06-10 DIAGNOSIS — M25531 Pain in right wrist: Secondary | ICD-10-CM | POA: Diagnosis not present

## 2017-06-10 DIAGNOSIS — M79643 Pain in unspecified hand: Secondary | ICD-10-CM | POA: Diagnosis not present

## 2017-06-10 DIAGNOSIS — Z79899 Other long term (current) drug therapy: Secondary | ICD-10-CM | POA: Diagnosis not present

## 2017-06-10 DIAGNOSIS — L4050 Arthropathic psoriasis, unspecified: Secondary | ICD-10-CM | POA: Diagnosis not present

## 2017-06-13 DIAGNOSIS — M25571 Pain in right ankle and joints of right foot: Secondary | ICD-10-CM | POA: Diagnosis not present

## 2017-07-03 DIAGNOSIS — M25571 Pain in right ankle and joints of right foot: Secondary | ICD-10-CM | POA: Diagnosis not present

## 2017-07-03 DIAGNOSIS — M76821 Posterior tibial tendinitis, right leg: Secondary | ICD-10-CM | POA: Diagnosis not present

## 2017-07-19 DIAGNOSIS — H40053 Ocular hypertension, bilateral: Secondary | ICD-10-CM | POA: Diagnosis not present

## 2017-07-23 DIAGNOSIS — I10 Essential (primary) hypertension: Secondary | ICD-10-CM | POA: Diagnosis not present

## 2017-07-23 DIAGNOSIS — H539 Unspecified visual disturbance: Secondary | ICD-10-CM | POA: Diagnosis not present

## 2017-07-26 DIAGNOSIS — L4050 Arthropathic psoriasis, unspecified: Secondary | ICD-10-CM | POA: Diagnosis not present

## 2017-09-12 DIAGNOSIS — M25531 Pain in right wrist: Secondary | ICD-10-CM | POA: Diagnosis not present

## 2017-09-12 DIAGNOSIS — M79643 Pain in unspecified hand: Secondary | ICD-10-CM | POA: Diagnosis not present

## 2017-09-12 DIAGNOSIS — Z79899 Other long term (current) drug therapy: Secondary | ICD-10-CM | POA: Diagnosis not present

## 2017-09-12 DIAGNOSIS — L4050 Arthropathic psoriasis, unspecified: Secondary | ICD-10-CM | POA: Diagnosis not present

## 2017-09-20 DIAGNOSIS — L4050 Arthropathic psoriasis, unspecified: Secondary | ICD-10-CM | POA: Diagnosis not present

## 2017-09-20 DIAGNOSIS — Z79899 Other long term (current) drug therapy: Secondary | ICD-10-CM | POA: Diagnosis not present

## 2017-09-20 DIAGNOSIS — R5383 Other fatigue: Secondary | ICD-10-CM | POA: Diagnosis not present

## 2017-10-04 DIAGNOSIS — H539 Unspecified visual disturbance: Secondary | ICD-10-CM | POA: Diagnosis not present

## 2017-10-04 DIAGNOSIS — Z23 Encounter for immunization: Secondary | ICD-10-CM | POA: Diagnosis not present

## 2017-10-14 DIAGNOSIS — H16143 Punctate keratitis, bilateral: Secondary | ICD-10-CM | POA: Diagnosis not present

## 2017-10-14 DIAGNOSIS — H04123 Dry eye syndrome of bilateral lacrimal glands: Secondary | ICD-10-CM | POA: Diagnosis not present

## 2017-10-24 DIAGNOSIS — H40033 Anatomical narrow angle, bilateral: Secondary | ICD-10-CM | POA: Diagnosis not present

## 2017-10-24 DIAGNOSIS — H04123 Dry eye syndrome of bilateral lacrimal glands: Secondary | ICD-10-CM | POA: Diagnosis not present

## 2017-10-25 DIAGNOSIS — L409 Psoriasis, unspecified: Secondary | ICD-10-CM | POA: Diagnosis not present

## 2017-10-28 DIAGNOSIS — L249 Irritant contact dermatitis, unspecified cause: Secondary | ICD-10-CM | POA: Diagnosis not present

## 2017-11-15 DIAGNOSIS — L4050 Arthropathic psoriasis, unspecified: Secondary | ICD-10-CM | POA: Diagnosis not present

## 2017-11-26 DIAGNOSIS — R0989 Other specified symptoms and signs involving the circulatory and respiratory systems: Secondary | ICD-10-CM | POA: Diagnosis not present

## 2017-11-26 DIAGNOSIS — R05 Cough: Secondary | ICD-10-CM | POA: Diagnosis not present

## 2017-11-26 DIAGNOSIS — J209 Acute bronchitis, unspecified: Secondary | ICD-10-CM | POA: Diagnosis not present

## 2017-12-09 DIAGNOSIS — R079 Chest pain, unspecified: Secondary | ICD-10-CM | POA: Diagnosis not present

## 2017-12-09 DIAGNOSIS — I10 Essential (primary) hypertension: Secondary | ICD-10-CM | POA: Diagnosis not present

## 2017-12-13 DIAGNOSIS — M25531 Pain in right wrist: Secondary | ICD-10-CM | POA: Diagnosis not present

## 2017-12-13 DIAGNOSIS — Z79899 Other long term (current) drug therapy: Secondary | ICD-10-CM | POA: Diagnosis not present

## 2017-12-13 DIAGNOSIS — L4050 Arthropathic psoriasis, unspecified: Secondary | ICD-10-CM | POA: Diagnosis not present

## 2017-12-13 DIAGNOSIS — M79643 Pain in unspecified hand: Secondary | ICD-10-CM | POA: Diagnosis not present

## 2018-01-10 DIAGNOSIS — L4050 Arthropathic psoriasis, unspecified: Secondary | ICD-10-CM | POA: Diagnosis not present

## 2018-01-29 DIAGNOSIS — M25531 Pain in right wrist: Secondary | ICD-10-CM | POA: Diagnosis not present

## 2018-02-07 DIAGNOSIS — Z79899 Other long term (current) drug therapy: Secondary | ICD-10-CM | POA: Diagnosis not present

## 2018-02-07 DIAGNOSIS — L4050 Arthropathic psoriasis, unspecified: Secondary | ICD-10-CM | POA: Diagnosis not present

## 2018-02-07 DIAGNOSIS — M79643 Pain in unspecified hand: Secondary | ICD-10-CM | POA: Diagnosis not present

## 2018-02-07 DIAGNOSIS — M25531 Pain in right wrist: Secondary | ICD-10-CM | POA: Diagnosis not present

## 2018-02-12 ENCOUNTER — Other Ambulatory Visit: Payer: Self-pay | Admitting: Sports Medicine

## 2018-02-12 DIAGNOSIS — M25531 Pain in right wrist: Secondary | ICD-10-CM | POA: Diagnosis not present

## 2018-02-12 DIAGNOSIS — J029 Acute pharyngitis, unspecified: Secondary | ICD-10-CM | POA: Diagnosis not present

## 2018-02-12 DIAGNOSIS — B349 Viral infection, unspecified: Secondary | ICD-10-CM | POA: Diagnosis not present

## 2018-02-24 ENCOUNTER — Ambulatory Visit
Admission: RE | Admit: 2018-02-24 | Discharge: 2018-02-24 | Disposition: A | Discharge status: Home / Self Care | Payer: BLUE CROSS/BLUE SHIELD | Source: Ambulatory Visit | Attending: Sports Medicine | Admitting: Sports Medicine

## 2018-02-24 DIAGNOSIS — S63511A Sprain of carpal joint of right wrist, initial encounter: Secondary | ICD-10-CM | POA: Diagnosis not present

## 2018-02-24 DIAGNOSIS — M25531 Pain in right wrist: Secondary | ICD-10-CM | POA: Diagnosis not present

## 2018-02-24 MED ORDER — IOPAMIDOL (ISOVUE-M 200) INJECTION 41%
2.0000 mL | Freq: Once | INTRAMUSCULAR | Status: AC
  Administered 2018-02-24: 2 mL via INTRA_ARTICULAR

## 2018-03-03 DIAGNOSIS — M24131 Other articular cartilage disorders, right wrist: Secondary | ICD-10-CM | POA: Diagnosis not present

## 2018-03-03 DIAGNOSIS — S63592A Other specified sprain of left wrist, initial encounter: Secondary | ICD-10-CM | POA: Diagnosis not present

## 2018-03-07 DIAGNOSIS — L4050 Arthropathic psoriasis, unspecified: Secondary | ICD-10-CM | POA: Diagnosis not present

## 2018-03-07 DIAGNOSIS — Z79899 Other long term (current) drug therapy: Secondary | ICD-10-CM | POA: Diagnosis not present

## 2018-03-21 DIAGNOSIS — S63591A Other specified sprain of right wrist, initial encounter: Secondary | ICD-10-CM | POA: Diagnosis not present

## 2018-03-21 DIAGNOSIS — S62511A Displaced fracture of proximal phalanx of right thumb, initial encounter for closed fracture: Secondary | ICD-10-CM | POA: Diagnosis not present

## 2018-03-21 DIAGNOSIS — S63511A Sprain of carpal joint of right wrist, initial encounter: Secondary | ICD-10-CM | POA: Diagnosis not present

## 2018-03-25 DIAGNOSIS — M24131 Other articular cartilage disorders, right wrist: Secondary | ICD-10-CM | POA: Diagnosis not present

## 2018-03-25 DIAGNOSIS — S63592A Other specified sprain of left wrist, initial encounter: Secondary | ICD-10-CM | POA: Diagnosis not present

## 2018-04-02 DIAGNOSIS — R05 Cough: Secondary | ICD-10-CM | POA: Diagnosis not present

## 2018-04-15 DIAGNOSIS — R05 Cough: Secondary | ICD-10-CM | POA: Diagnosis not present

## 2018-04-15 DIAGNOSIS — J00 Acute nasopharyngitis [common cold]: Secondary | ICD-10-CM | POA: Diagnosis not present

## 2018-05-01 DIAGNOSIS — L4050 Arthropathic psoriasis, unspecified: Secondary | ICD-10-CM | POA: Diagnosis not present

## 2018-05-02 DIAGNOSIS — M25431 Effusion, right wrist: Secondary | ICD-10-CM | POA: Diagnosis not present

## 2018-05-02 DIAGNOSIS — M25531 Pain in right wrist: Secondary | ICD-10-CM | POA: Diagnosis not present

## 2018-05-02 DIAGNOSIS — S63592A Other specified sprain of left wrist, initial encounter: Secondary | ICD-10-CM | POA: Diagnosis not present

## 2018-05-02 DIAGNOSIS — M24131 Other articular cartilage disorders, right wrist: Secondary | ICD-10-CM | POA: Diagnosis not present

## 2018-05-08 DIAGNOSIS — M25531 Pain in right wrist: Secondary | ICD-10-CM | POA: Diagnosis not present

## 2018-05-08 DIAGNOSIS — Z79899 Other long term (current) drug therapy: Secondary | ICD-10-CM | POA: Diagnosis not present

## 2018-05-08 DIAGNOSIS — L4050 Arthropathic psoriasis, unspecified: Secondary | ICD-10-CM | POA: Diagnosis not present

## 2018-05-08 DIAGNOSIS — M79643 Pain in unspecified hand: Secondary | ICD-10-CM | POA: Diagnosis not present

## 2018-05-09 DIAGNOSIS — M24131 Other articular cartilage disorders, right wrist: Secondary | ICD-10-CM | POA: Diagnosis not present

## 2018-05-09 DIAGNOSIS — M25531 Pain in right wrist: Secondary | ICD-10-CM | POA: Diagnosis not present

## 2018-05-09 DIAGNOSIS — S63592A Other specified sprain of left wrist, initial encounter: Secondary | ICD-10-CM | POA: Diagnosis not present

## 2018-05-09 DIAGNOSIS — M25431 Effusion, right wrist: Secondary | ICD-10-CM | POA: Diagnosis not present

## 2018-05-16 DIAGNOSIS — M25531 Pain in right wrist: Secondary | ICD-10-CM | POA: Diagnosis not present

## 2018-05-16 DIAGNOSIS — M25431 Effusion, right wrist: Secondary | ICD-10-CM | POA: Diagnosis not present

## 2018-05-16 DIAGNOSIS — M24131 Other articular cartilage disorders, right wrist: Secondary | ICD-10-CM | POA: Diagnosis not present

## 2018-05-16 DIAGNOSIS — S63592A Other specified sprain of left wrist, initial encounter: Secondary | ICD-10-CM | POA: Diagnosis not present

## 2018-05-21 DIAGNOSIS — M24131 Other articular cartilage disorders, right wrist: Secondary | ICD-10-CM | POA: Diagnosis not present

## 2018-05-21 DIAGNOSIS — M25431 Effusion, right wrist: Secondary | ICD-10-CM | POA: Diagnosis not present

## 2018-05-21 DIAGNOSIS — S63592A Other specified sprain of left wrist, initial encounter: Secondary | ICD-10-CM | POA: Diagnosis not present

## 2018-05-21 DIAGNOSIS — M25531 Pain in right wrist: Secondary | ICD-10-CM | POA: Diagnosis not present

## 2018-05-30 DIAGNOSIS — M25531 Pain in right wrist: Secondary | ICD-10-CM | POA: Diagnosis not present

## 2018-05-30 DIAGNOSIS — S63592A Other specified sprain of left wrist, initial encounter: Secondary | ICD-10-CM | POA: Diagnosis not present

## 2018-05-30 DIAGNOSIS — M24131 Other articular cartilage disorders, right wrist: Secondary | ICD-10-CM | POA: Diagnosis not present

## 2018-05-30 DIAGNOSIS — M25431 Effusion, right wrist: Secondary | ICD-10-CM | POA: Diagnosis not present

## 2018-06-13 DIAGNOSIS — M24131 Other articular cartilage disorders, right wrist: Secondary | ICD-10-CM | POA: Diagnosis not present

## 2018-06-13 DIAGNOSIS — S63592A Other specified sprain of left wrist, initial encounter: Secondary | ICD-10-CM | POA: Diagnosis not present

## 2018-06-13 DIAGNOSIS — I10 Essential (primary) hypertension: Secondary | ICD-10-CM | POA: Diagnosis not present

## 2018-06-13 DIAGNOSIS — M25531 Pain in right wrist: Secondary | ICD-10-CM | POA: Diagnosis not present

## 2018-06-13 DIAGNOSIS — M25431 Effusion, right wrist: Secondary | ICD-10-CM | POA: Diagnosis not present

## 2018-06-17 DIAGNOSIS — M24131 Other articular cartilage disorders, right wrist: Secondary | ICD-10-CM | POA: Insufficient documentation

## 2018-06-17 DIAGNOSIS — M1811 Unilateral primary osteoarthritis of first carpometacarpal joint, right hand: Secondary | ICD-10-CM | POA: Insufficient documentation

## 2018-06-17 DIAGNOSIS — S63592A Other specified sprain of left wrist, initial encounter: Secondary | ICD-10-CM | POA: Diagnosis not present

## 2018-06-17 DIAGNOSIS — M79644 Pain in right finger(s): Secondary | ICD-10-CM | POA: Diagnosis not present

## 2018-06-26 DIAGNOSIS — L4050 Arthropathic psoriasis, unspecified: Secondary | ICD-10-CM | POA: Diagnosis not present

## 2018-06-26 DIAGNOSIS — R300 Dysuria: Secondary | ICD-10-CM | POA: Diagnosis not present

## 2018-07-22 DIAGNOSIS — S63592A Other specified sprain of left wrist, initial encounter: Secondary | ICD-10-CM | POA: Diagnosis not present

## 2018-07-22 DIAGNOSIS — M79644 Pain in right finger(s): Secondary | ICD-10-CM | POA: Diagnosis not present

## 2018-07-22 DIAGNOSIS — M24131 Other articular cartilage disorders, right wrist: Secondary | ICD-10-CM | POA: Diagnosis not present

## 2018-07-22 DIAGNOSIS — M1811 Unilateral primary osteoarthritis of first carpometacarpal joint, right hand: Secondary | ICD-10-CM | POA: Diagnosis not present

## 2018-08-07 DIAGNOSIS — M79643 Pain in unspecified hand: Secondary | ICD-10-CM | POA: Diagnosis not present

## 2018-08-07 DIAGNOSIS — L409 Psoriasis, unspecified: Secondary | ICD-10-CM | POA: Diagnosis not present

## 2018-08-07 DIAGNOSIS — Z79899 Other long term (current) drug therapy: Secondary | ICD-10-CM | POA: Diagnosis not present

## 2018-08-07 DIAGNOSIS — L4050 Arthropathic psoriasis, unspecified: Secondary | ICD-10-CM | POA: Diagnosis not present

## 2018-08-26 DIAGNOSIS — L4050 Arthropathic psoriasis, unspecified: Secondary | ICD-10-CM | POA: Diagnosis not present

## 2018-10-24 DIAGNOSIS — L4050 Arthropathic psoriasis, unspecified: Secondary | ICD-10-CM | POA: Diagnosis not present

## 2018-11-08 DIAGNOSIS — Z20828 Contact with and (suspected) exposure to other viral communicable diseases: Secondary | ICD-10-CM | POA: Diagnosis not present

## 2018-12-22 DIAGNOSIS — L4050 Arthropathic psoriasis, unspecified: Secondary | ICD-10-CM | POA: Diagnosis not present

## 2018-12-26 ENCOUNTER — Other Ambulatory Visit: Payer: Self-pay | Admitting: Internal Medicine

## 2018-12-26 DIAGNOSIS — Z0000 Encounter for general adult medical examination without abnormal findings: Secondary | ICD-10-CM | POA: Diagnosis not present

## 2018-12-26 DIAGNOSIS — I10 Essential (primary) hypertension: Secondary | ICD-10-CM | POA: Diagnosis not present

## 2018-12-26 DIAGNOSIS — L4050 Arthropathic psoriasis, unspecified: Secondary | ICD-10-CM | POA: Diagnosis not present

## 2018-12-26 DIAGNOSIS — Z23 Encounter for immunization: Secondary | ICD-10-CM | POA: Diagnosis not present

## 2018-12-26 DIAGNOSIS — E039 Hypothyroidism, unspecified: Secondary | ICD-10-CM | POA: Diagnosis not present

## 2018-12-26 DIAGNOSIS — E782 Mixed hyperlipidemia: Secondary | ICD-10-CM | POA: Diagnosis not present

## 2018-12-26 DIAGNOSIS — Z1231 Encounter for screening mammogram for malignant neoplasm of breast: Secondary | ICD-10-CM

## 2018-12-31 DIAGNOSIS — E782 Mixed hyperlipidemia: Secondary | ICD-10-CM | POA: Diagnosis not present

## 2018-12-31 DIAGNOSIS — Z1159 Encounter for screening for other viral diseases: Secondary | ICD-10-CM | POA: Diagnosis not present

## 2018-12-31 DIAGNOSIS — E039 Hypothyroidism, unspecified: Secondary | ICD-10-CM | POA: Diagnosis not present

## 2018-12-31 DIAGNOSIS — D509 Iron deficiency anemia, unspecified: Secondary | ICD-10-CM | POA: Diagnosis not present

## 2018-12-31 DIAGNOSIS — I10 Essential (primary) hypertension: Secondary | ICD-10-CM | POA: Diagnosis not present

## 2019-02-06 DIAGNOSIS — L4050 Arthropathic psoriasis, unspecified: Secondary | ICD-10-CM | POA: Diagnosis not present

## 2019-02-06 DIAGNOSIS — Z79899 Other long term (current) drug therapy: Secondary | ICD-10-CM | POA: Diagnosis not present

## 2019-02-06 DIAGNOSIS — M79643 Pain in unspecified hand: Secondary | ICD-10-CM | POA: Diagnosis not present

## 2019-02-06 DIAGNOSIS — L409 Psoriasis, unspecified: Secondary | ICD-10-CM | POA: Diagnosis not present

## 2019-02-16 DIAGNOSIS — L4050 Arthropathic psoriasis, unspecified: Secondary | ICD-10-CM | POA: Diagnosis not present

## 2019-02-17 ENCOUNTER — Other Ambulatory Visit: Payer: Self-pay

## 2019-02-17 ENCOUNTER — Ambulatory Visit
Admission: RE | Admit: 2019-02-17 | Discharge: 2019-02-17 | Disposition: A | Discharge status: Home / Self Care | Payer: BC Managed Care – PPO | Source: Ambulatory Visit | Attending: Internal Medicine | Admitting: Internal Medicine

## 2019-02-17 DIAGNOSIS — Z1231 Encounter for screening mammogram for malignant neoplasm of breast: Secondary | ICD-10-CM

## 2019-02-22 IMAGING — DX DG CHEST 2V
2 series · 2 of 2 positions shown · non-contrast
Comparison: Chest x-ray dated March 21, 2015.

CLINICAL DATA: Shortness of breath for 2 weeks.  Chest pain.

EXAM:
CHEST  2 VIEW

[dg chest 2 view (1 of 2)]
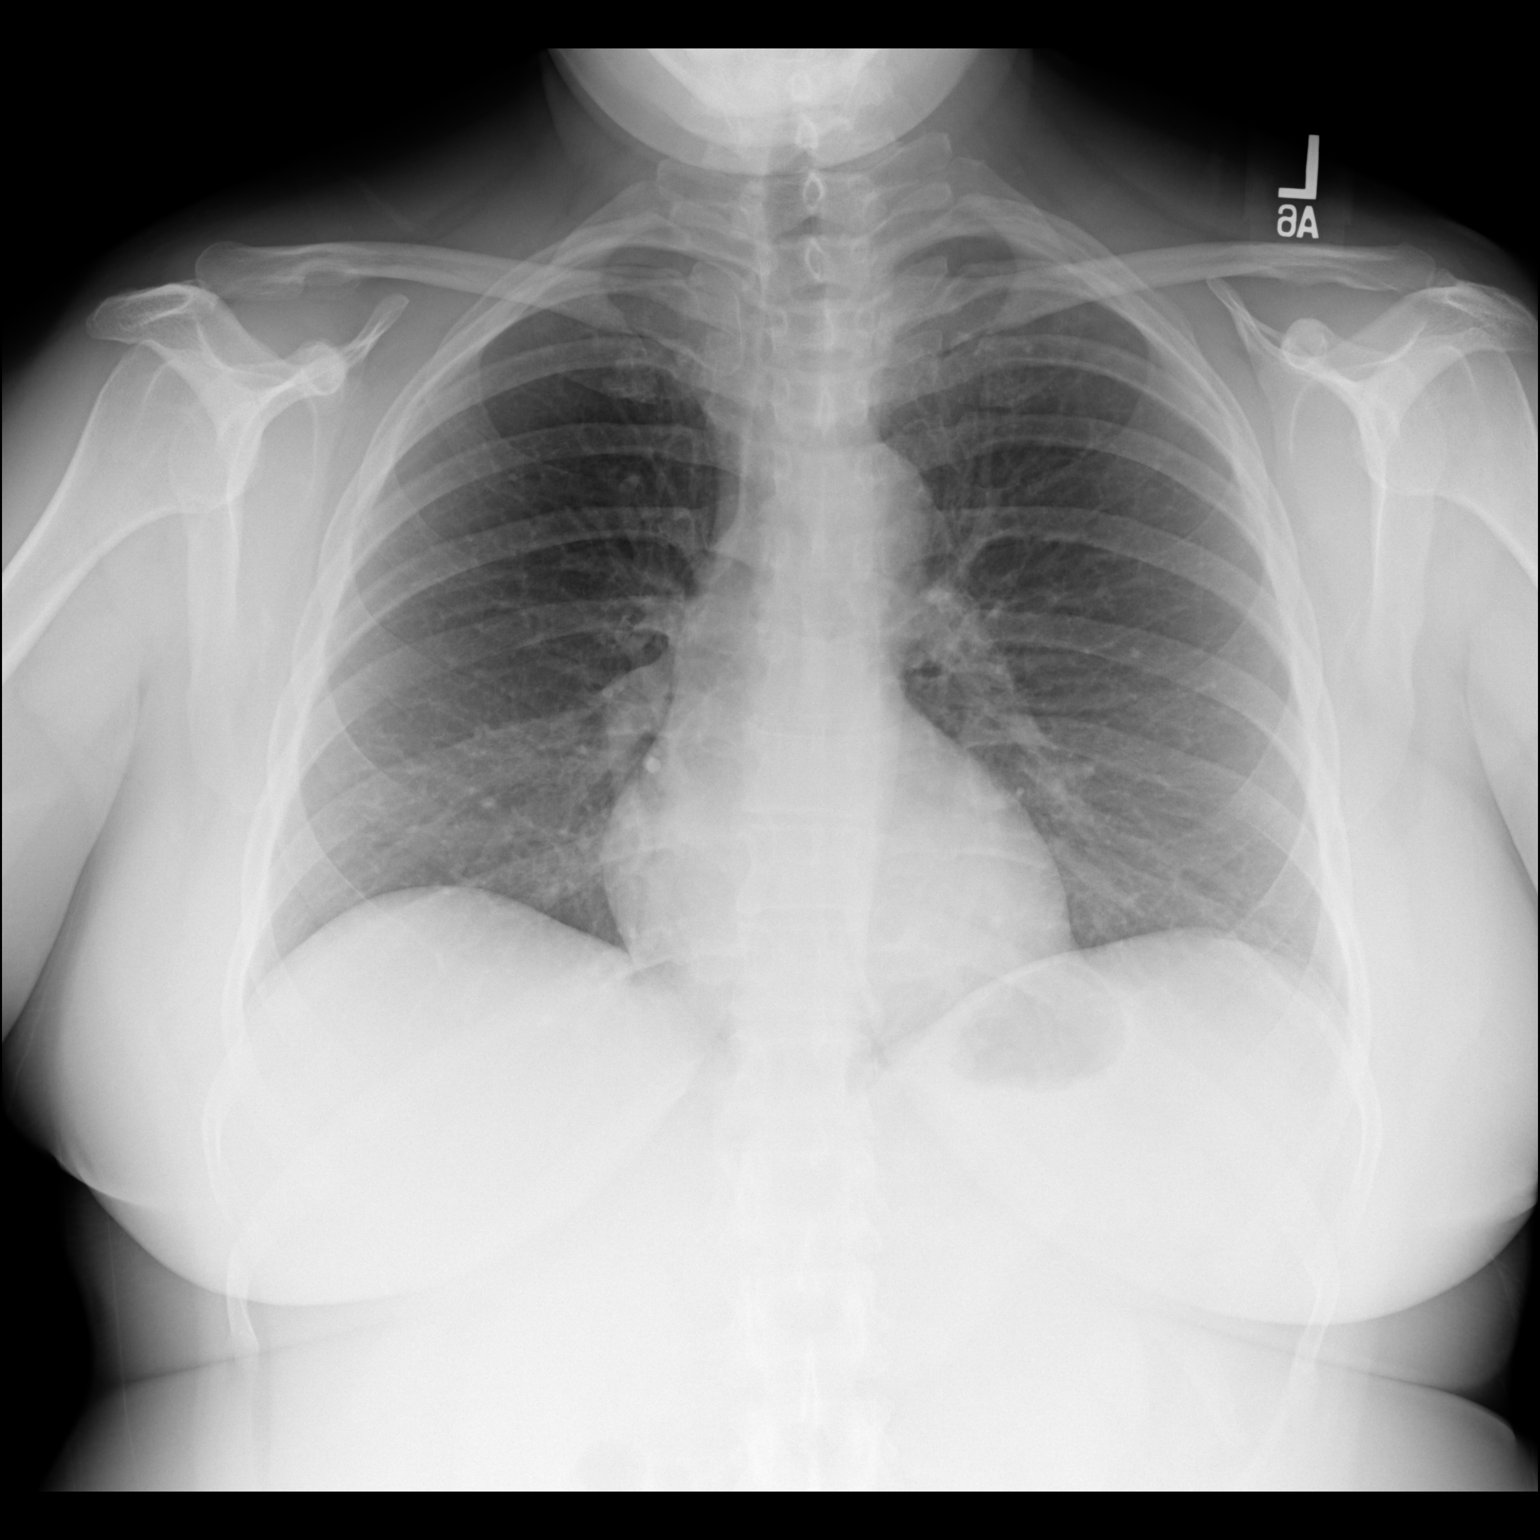

[dg chest 2 view (2 of 2)]
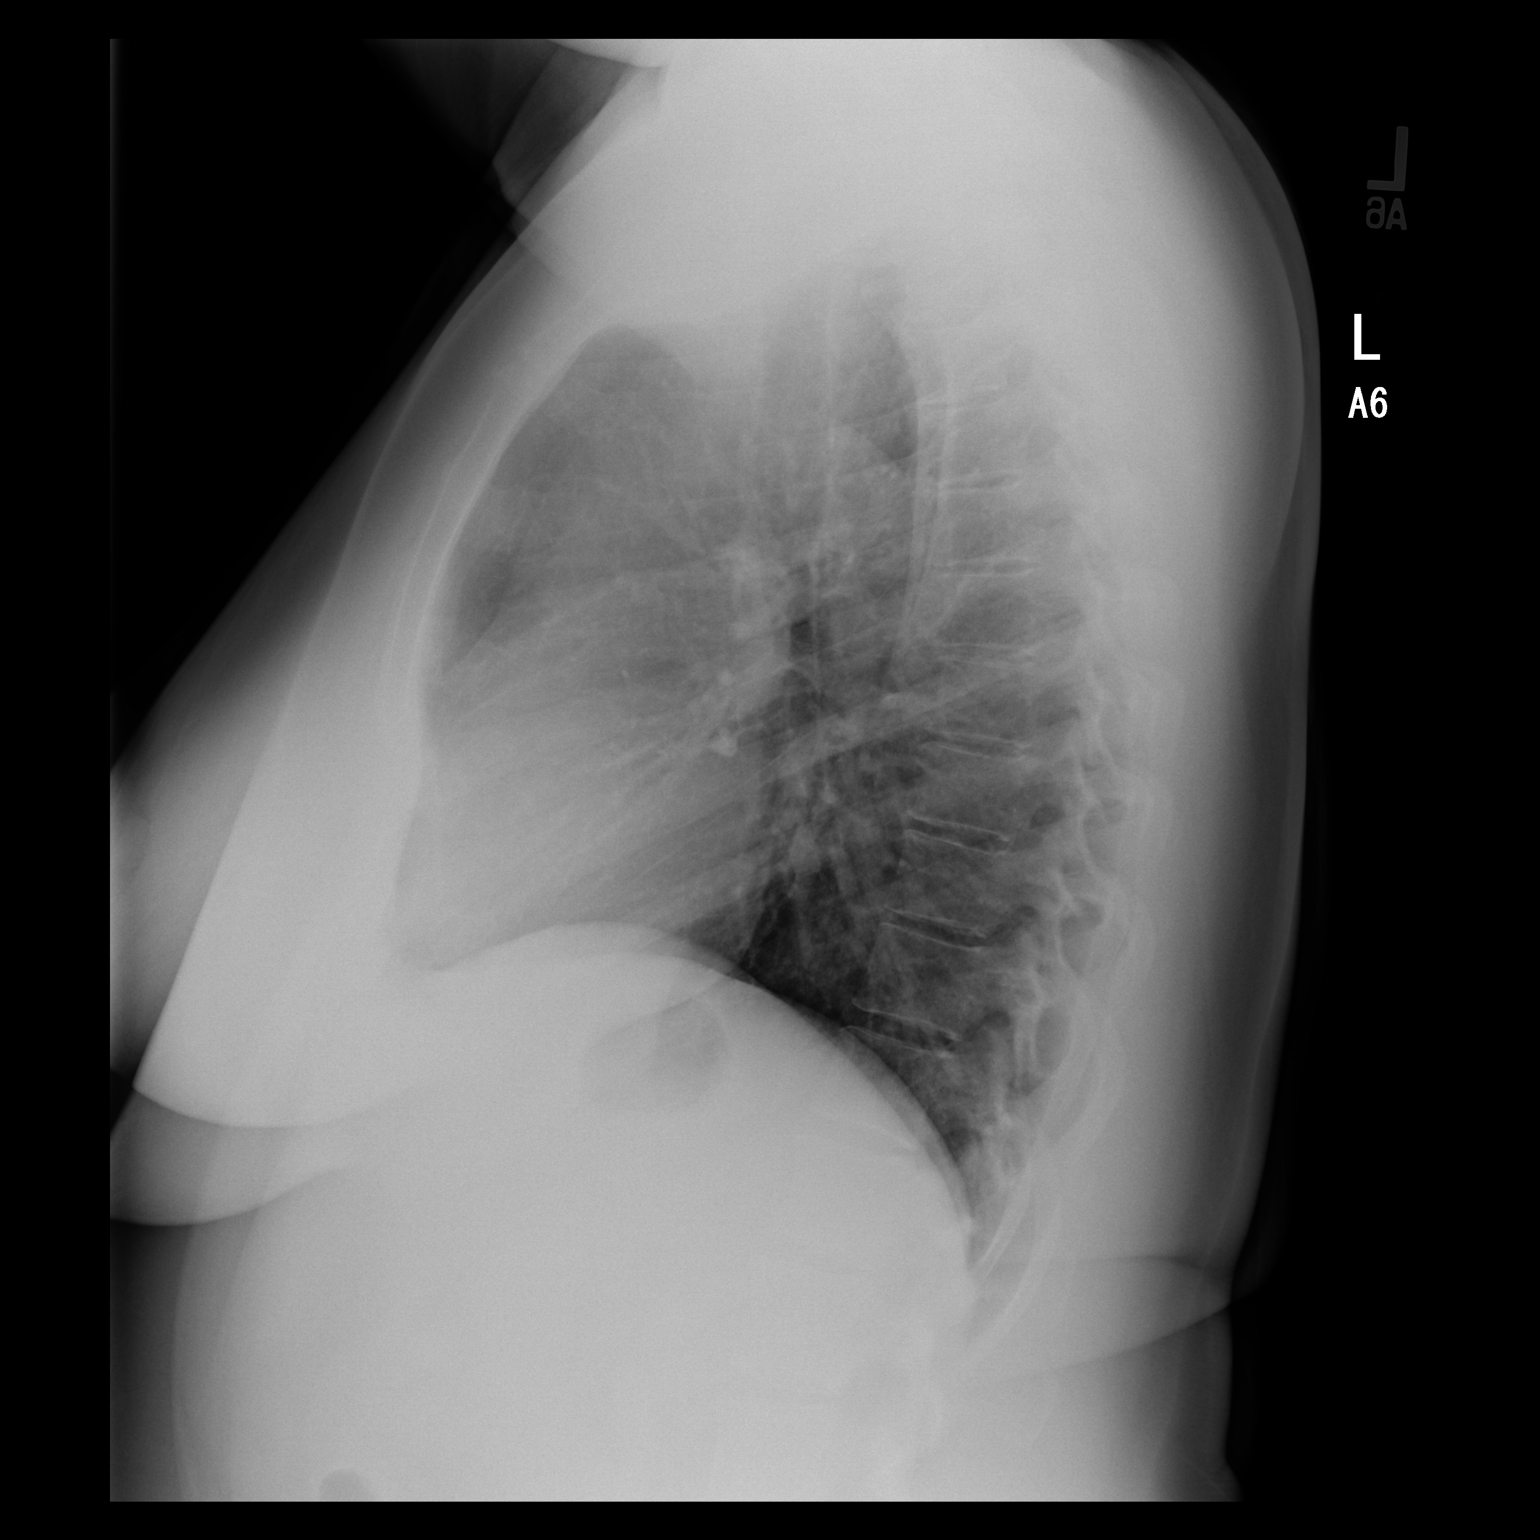

[2 of 2 positions shown; findings below may reference images not displayed]

FINDINGS: The cardiomediastinal silhouette is normal in size. Normal pulmonary
vascularity. No focal consolidation, pleural effusion, or
pneumothorax. No acute osseous abnormality.
IMPRESSION: Normal chest x-ray.

## 2019-03-06 DIAGNOSIS — E039 Hypothyroidism, unspecified: Secondary | ICD-10-CM | POA: Diagnosis not present

## 2019-03-17 ENCOUNTER — Ambulatory Visit: Payer: BC Managed Care – PPO | Attending: Internal Medicine

## 2019-03-17 DIAGNOSIS — Z20822 Contact with and (suspected) exposure to covid-19: Secondary | ICD-10-CM | POA: Diagnosis not present

## 2019-03-18 LAB — NOVEL CORONAVIRUS, NAA: SARS-CoV-2, NAA: NOT DETECTED

## 2019-04-13 DIAGNOSIS — M1990 Unspecified osteoarthritis, unspecified site: Secondary | ICD-10-CM | POA: Diagnosis not present

## 2019-04-13 DIAGNOSIS — Z79899 Other long term (current) drug therapy: Secondary | ICD-10-CM | POA: Diagnosis not present

## 2019-04-13 DIAGNOSIS — L4050 Arthropathic psoriasis, unspecified: Secondary | ICD-10-CM | POA: Diagnosis not present

## 2019-04-22 DIAGNOSIS — G4719 Other hypersomnia: Secondary | ICD-10-CM | POA: Diagnosis not present

## 2019-04-22 DIAGNOSIS — G2581 Restless legs syndrome: Secondary | ICD-10-CM | POA: Diagnosis not present

## 2019-04-30 DIAGNOSIS — M545 Low back pain: Secondary | ICD-10-CM | POA: Diagnosis not present

## 2019-04-30 DIAGNOSIS — S63501A Unspecified sprain of right wrist, initial encounter: Secondary | ICD-10-CM | POA: Diagnosis not present

## 2019-04-30 DIAGNOSIS — S40011A Contusion of right shoulder, initial encounter: Secondary | ICD-10-CM | POA: Diagnosis not present

## 2019-05-05 DIAGNOSIS — M545 Low back pain, unspecified: Secondary | ICD-10-CM | POA: Insufficient documentation

## 2019-05-05 DIAGNOSIS — M25551 Pain in right hip: Secondary | ICD-10-CM | POA: Diagnosis not present

## 2019-05-12 DIAGNOSIS — M545 Low back pain: Secondary | ICD-10-CM | POA: Diagnosis not present

## 2019-05-19 DIAGNOSIS — G4733 Obstructive sleep apnea (adult) (pediatric): Secondary | ICD-10-CM | POA: Diagnosis not present

## 2019-05-20 DIAGNOSIS — M546 Pain in thoracic spine: Secondary | ICD-10-CM | POA: Diagnosis not present

## 2019-05-20 DIAGNOSIS — M9907 Segmental and somatic dysfunction of upper extremity: Secondary | ICD-10-CM | POA: Diagnosis not present

## 2019-05-20 DIAGNOSIS — M25531 Pain in right wrist: Secondary | ICD-10-CM | POA: Diagnosis not present

## 2019-05-20 DIAGNOSIS — M9901 Segmental and somatic dysfunction of cervical region: Secondary | ICD-10-CM | POA: Diagnosis not present

## 2019-05-20 DIAGNOSIS — G4733 Obstructive sleep apnea (adult) (pediatric): Secondary | ICD-10-CM | POA: Diagnosis not present

## 2019-05-20 DIAGNOSIS — M9902 Segmental and somatic dysfunction of thoracic region: Secondary | ICD-10-CM | POA: Diagnosis not present

## 2019-05-20 DIAGNOSIS — M545 Low back pain: Secondary | ICD-10-CM | POA: Diagnosis not present

## 2019-05-20 DIAGNOSIS — M9903 Segmental and somatic dysfunction of lumbar region: Secondary | ICD-10-CM | POA: Diagnosis not present

## 2019-05-20 DIAGNOSIS — M25511 Pain in right shoulder: Secondary | ICD-10-CM | POA: Diagnosis not present

## 2019-05-20 DIAGNOSIS — M9905 Segmental and somatic dysfunction of pelvic region: Secondary | ICD-10-CM | POA: Diagnosis not present

## 2019-05-21 DIAGNOSIS — M9905 Segmental and somatic dysfunction of pelvic region: Secondary | ICD-10-CM | POA: Diagnosis not present

## 2019-05-21 DIAGNOSIS — M9901 Segmental and somatic dysfunction of cervical region: Secondary | ICD-10-CM | POA: Diagnosis not present

## 2019-05-21 DIAGNOSIS — M9907 Segmental and somatic dysfunction of upper extremity: Secondary | ICD-10-CM | POA: Diagnosis not present

## 2019-05-21 DIAGNOSIS — M545 Low back pain: Secondary | ICD-10-CM | POA: Diagnosis not present

## 2019-05-21 DIAGNOSIS — M9902 Segmental and somatic dysfunction of thoracic region: Secondary | ICD-10-CM | POA: Diagnosis not present

## 2019-05-21 DIAGNOSIS — M9903 Segmental and somatic dysfunction of lumbar region: Secondary | ICD-10-CM | POA: Diagnosis not present

## 2019-05-22 DIAGNOSIS — M25511 Pain in right shoulder: Secondary | ICD-10-CM | POA: Diagnosis not present

## 2019-05-22 DIAGNOSIS — M9902 Segmental and somatic dysfunction of thoracic region: Secondary | ICD-10-CM | POA: Diagnosis not present

## 2019-05-22 DIAGNOSIS — M9901 Segmental and somatic dysfunction of cervical region: Secondary | ICD-10-CM | POA: Diagnosis not present

## 2019-05-22 DIAGNOSIS — M9907 Segmental and somatic dysfunction of upper extremity: Secondary | ICD-10-CM | POA: Diagnosis not present

## 2019-05-22 DIAGNOSIS — M9903 Segmental and somatic dysfunction of lumbar region: Secondary | ICD-10-CM | POA: Diagnosis not present

## 2019-05-22 DIAGNOSIS — M545 Low back pain: Secondary | ICD-10-CM | POA: Diagnosis not present

## 2019-05-22 DIAGNOSIS — M25531 Pain in right wrist: Secondary | ICD-10-CM | POA: Diagnosis not present

## 2019-05-22 DIAGNOSIS — M9905 Segmental and somatic dysfunction of pelvic region: Secondary | ICD-10-CM | POA: Diagnosis not present

## 2019-05-25 DIAGNOSIS — M9905 Segmental and somatic dysfunction of pelvic region: Secondary | ICD-10-CM | POA: Diagnosis not present

## 2019-05-25 DIAGNOSIS — M545 Low back pain: Secondary | ICD-10-CM | POA: Diagnosis not present

## 2019-05-25 DIAGNOSIS — M9907 Segmental and somatic dysfunction of upper extremity: Secondary | ICD-10-CM | POA: Diagnosis not present

## 2019-05-25 DIAGNOSIS — M9903 Segmental and somatic dysfunction of lumbar region: Secondary | ICD-10-CM | POA: Diagnosis not present

## 2019-05-27 DIAGNOSIS — M9903 Segmental and somatic dysfunction of lumbar region: Secondary | ICD-10-CM | POA: Diagnosis not present

## 2019-05-27 DIAGNOSIS — M9907 Segmental and somatic dysfunction of upper extremity: Secondary | ICD-10-CM | POA: Diagnosis not present

## 2019-05-27 DIAGNOSIS — M545 Low back pain: Secondary | ICD-10-CM | POA: Diagnosis not present

## 2019-05-27 DIAGNOSIS — M9905 Segmental and somatic dysfunction of pelvic region: Secondary | ICD-10-CM | POA: Diagnosis not present

## 2019-05-29 DIAGNOSIS — M9903 Segmental and somatic dysfunction of lumbar region: Secondary | ICD-10-CM | POA: Diagnosis not present

## 2019-05-29 DIAGNOSIS — M9905 Segmental and somatic dysfunction of pelvic region: Secondary | ICD-10-CM | POA: Diagnosis not present

## 2019-05-29 DIAGNOSIS — M545 Low back pain: Secondary | ICD-10-CM | POA: Diagnosis not present

## 2019-05-29 DIAGNOSIS — M9907 Segmental and somatic dysfunction of upper extremity: Secondary | ICD-10-CM | POA: Diagnosis not present

## 2019-06-01 DIAGNOSIS — M545 Low back pain: Secondary | ICD-10-CM | POA: Diagnosis not present

## 2019-06-01 DIAGNOSIS — M9905 Segmental and somatic dysfunction of pelvic region: Secondary | ICD-10-CM | POA: Diagnosis not present

## 2019-06-01 DIAGNOSIS — M9907 Segmental and somatic dysfunction of upper extremity: Secondary | ICD-10-CM | POA: Diagnosis not present

## 2019-06-01 DIAGNOSIS — M9903 Segmental and somatic dysfunction of lumbar region: Secondary | ICD-10-CM | POA: Diagnosis not present

## 2019-06-02 DIAGNOSIS — G4733 Obstructive sleep apnea (adult) (pediatric): Secondary | ICD-10-CM | POA: Diagnosis not present

## 2019-06-03 DIAGNOSIS — M545 Low back pain: Secondary | ICD-10-CM | POA: Diagnosis not present

## 2019-06-03 DIAGNOSIS — M9903 Segmental and somatic dysfunction of lumbar region: Secondary | ICD-10-CM | POA: Diagnosis not present

## 2019-06-03 DIAGNOSIS — M9905 Segmental and somatic dysfunction of pelvic region: Secondary | ICD-10-CM | POA: Diagnosis not present

## 2019-06-03 DIAGNOSIS — M9907 Segmental and somatic dysfunction of upper extremity: Secondary | ICD-10-CM | POA: Diagnosis not present

## 2019-06-04 DIAGNOSIS — M9905 Segmental and somatic dysfunction of pelvic region: Secondary | ICD-10-CM | POA: Diagnosis not present

## 2019-06-04 DIAGNOSIS — M9903 Segmental and somatic dysfunction of lumbar region: Secondary | ICD-10-CM | POA: Diagnosis not present

## 2019-06-04 DIAGNOSIS — M9907 Segmental and somatic dysfunction of upper extremity: Secondary | ICD-10-CM | POA: Diagnosis not present

## 2019-06-04 DIAGNOSIS — M545 Low back pain: Secondary | ICD-10-CM | POA: Diagnosis not present

## 2019-06-08 DIAGNOSIS — M79643 Pain in unspecified hand: Secondary | ICD-10-CM | POA: Diagnosis not present

## 2019-06-08 DIAGNOSIS — M545 Low back pain: Secondary | ICD-10-CM | POA: Diagnosis not present

## 2019-06-08 DIAGNOSIS — L409 Psoriasis, unspecified: Secondary | ICD-10-CM | POA: Diagnosis not present

## 2019-06-08 DIAGNOSIS — M9905 Segmental and somatic dysfunction of pelvic region: Secondary | ICD-10-CM | POA: Diagnosis not present

## 2019-06-08 DIAGNOSIS — M25511 Pain in right shoulder: Secondary | ICD-10-CM | POA: Diagnosis not present

## 2019-06-08 DIAGNOSIS — M9907 Segmental and somatic dysfunction of upper extremity: Secondary | ICD-10-CM | POA: Diagnosis not present

## 2019-06-08 DIAGNOSIS — Z79899 Other long term (current) drug therapy: Secondary | ICD-10-CM | POA: Diagnosis not present

## 2019-06-08 DIAGNOSIS — L4050 Arthropathic psoriasis, unspecified: Secondary | ICD-10-CM | POA: Diagnosis not present

## 2019-06-08 DIAGNOSIS — M9903 Segmental and somatic dysfunction of lumbar region: Secondary | ICD-10-CM | POA: Diagnosis not present

## 2019-06-10 DIAGNOSIS — M9907 Segmental and somatic dysfunction of upper extremity: Secondary | ICD-10-CM | POA: Diagnosis not present

## 2019-06-10 DIAGNOSIS — M9903 Segmental and somatic dysfunction of lumbar region: Secondary | ICD-10-CM | POA: Diagnosis not present

## 2019-06-10 DIAGNOSIS — L4050 Arthropathic psoriasis, unspecified: Secondary | ICD-10-CM | POA: Diagnosis not present

## 2019-06-10 DIAGNOSIS — M9905 Segmental and somatic dysfunction of pelvic region: Secondary | ICD-10-CM | POA: Diagnosis not present

## 2019-06-10 DIAGNOSIS — M545 Low back pain: Secondary | ICD-10-CM | POA: Diagnosis not present

## 2019-06-30 DIAGNOSIS — R1030 Lower abdominal pain, unspecified: Secondary | ICD-10-CM | POA: Diagnosis not present

## 2019-06-30 DIAGNOSIS — N390 Urinary tract infection, site not specified: Secondary | ICD-10-CM | POA: Diagnosis not present

## 2019-06-30 DIAGNOSIS — R1032 Left lower quadrant pain: Secondary | ICD-10-CM | POA: Diagnosis not present

## 2019-06-30 DIAGNOSIS — N3010 Interstitial cystitis (chronic) without hematuria: Secondary | ICD-10-CM | POA: Diagnosis not present

## 2019-07-15 DIAGNOSIS — M545 Low back pain: Secondary | ICD-10-CM | POA: Diagnosis not present

## 2019-07-15 DIAGNOSIS — M9903 Segmental and somatic dysfunction of lumbar region: Secondary | ICD-10-CM | POA: Diagnosis not present

## 2019-07-15 DIAGNOSIS — M9905 Segmental and somatic dysfunction of pelvic region: Secondary | ICD-10-CM | POA: Diagnosis not present

## 2019-07-15 DIAGNOSIS — M5442 Lumbago with sciatica, left side: Secondary | ICD-10-CM | POA: Diagnosis not present

## 2019-07-20 DIAGNOSIS — M9902 Segmental and somatic dysfunction of thoracic region: Secondary | ICD-10-CM | POA: Diagnosis not present

## 2019-07-20 DIAGNOSIS — M9901 Segmental and somatic dysfunction of cervical region: Secondary | ICD-10-CM | POA: Diagnosis not present

## 2019-07-20 DIAGNOSIS — M25511 Pain in right shoulder: Secondary | ICD-10-CM | POA: Diagnosis not present

## 2019-07-20 DIAGNOSIS — M25531 Pain in right wrist: Secondary | ICD-10-CM | POA: Diagnosis not present

## 2019-07-20 DIAGNOSIS — M9903 Segmental and somatic dysfunction of lumbar region: Secondary | ICD-10-CM | POA: Diagnosis not present

## 2019-07-20 DIAGNOSIS — M9907 Segmental and somatic dysfunction of upper extremity: Secondary | ICD-10-CM | POA: Diagnosis not present

## 2019-07-21 DIAGNOSIS — M9903 Segmental and somatic dysfunction of lumbar region: Secondary | ICD-10-CM | POA: Diagnosis not present

## 2019-07-21 DIAGNOSIS — M545 Low back pain: Secondary | ICD-10-CM | POA: Diagnosis not present

## 2019-07-21 DIAGNOSIS — M9905 Segmental and somatic dysfunction of pelvic region: Secondary | ICD-10-CM | POA: Diagnosis not present

## 2019-07-21 DIAGNOSIS — M9907 Segmental and somatic dysfunction of upper extremity: Secondary | ICD-10-CM | POA: Diagnosis not present

## 2019-07-22 DIAGNOSIS — M9905 Segmental and somatic dysfunction of pelvic region: Secondary | ICD-10-CM | POA: Diagnosis not present

## 2019-07-22 DIAGNOSIS — M545 Low back pain: Secondary | ICD-10-CM | POA: Diagnosis not present

## 2019-07-22 DIAGNOSIS — M9907 Segmental and somatic dysfunction of upper extremity: Secondary | ICD-10-CM | POA: Diagnosis not present

## 2019-07-22 DIAGNOSIS — M9903 Segmental and somatic dysfunction of lumbar region: Secondary | ICD-10-CM | POA: Diagnosis not present

## 2019-08-04 DIAGNOSIS — Z03818 Encounter for observation for suspected exposure to other biological agents ruled out: Secondary | ICD-10-CM | POA: Diagnosis not present

## 2019-08-04 DIAGNOSIS — Z20822 Contact with and (suspected) exposure to covid-19: Secondary | ICD-10-CM | POA: Diagnosis not present

## 2019-08-06 DIAGNOSIS — M545 Low back pain: Secondary | ICD-10-CM | POA: Diagnosis not present

## 2019-08-06 DIAGNOSIS — M9905 Segmental and somatic dysfunction of pelvic region: Secondary | ICD-10-CM | POA: Diagnosis not present

## 2019-08-06 DIAGNOSIS — R32 Unspecified urinary incontinence: Secondary | ICD-10-CM | POA: Diagnosis not present

## 2019-08-06 DIAGNOSIS — M9903 Segmental and somatic dysfunction of lumbar region: Secondary | ICD-10-CM | POA: Diagnosis not present

## 2019-08-06 DIAGNOSIS — R351 Nocturia: Secondary | ICD-10-CM | POA: Diagnosis not present

## 2019-08-06 DIAGNOSIS — N3010 Interstitial cystitis (chronic) without hematuria: Secondary | ICD-10-CM | POA: Diagnosis not present

## 2019-08-06 DIAGNOSIS — R3915 Urgency of urination: Secondary | ICD-10-CM | POA: Diagnosis not present

## 2019-08-06 DIAGNOSIS — M9907 Segmental and somatic dysfunction of upper extremity: Secondary | ICD-10-CM | POA: Diagnosis not present

## 2019-08-07 DIAGNOSIS — L4050 Arthropathic psoriasis, unspecified: Secondary | ICD-10-CM | POA: Diagnosis not present

## 2019-08-10 DIAGNOSIS — M545 Low back pain: Secondary | ICD-10-CM | POA: Diagnosis not present

## 2019-08-10 DIAGNOSIS — M9907 Segmental and somatic dysfunction of upper extremity: Secondary | ICD-10-CM | POA: Diagnosis not present

## 2019-08-10 DIAGNOSIS — M9903 Segmental and somatic dysfunction of lumbar region: Secondary | ICD-10-CM | POA: Diagnosis not present

## 2019-08-10 DIAGNOSIS — M9905 Segmental and somatic dysfunction of pelvic region: Secondary | ICD-10-CM | POA: Diagnosis not present

## 2019-08-19 DIAGNOSIS — N393 Stress incontinence (female) (male): Secondary | ICD-10-CM | POA: Diagnosis not present

## 2019-08-19 DIAGNOSIS — N3010 Interstitial cystitis (chronic) without hematuria: Secondary | ICD-10-CM | POA: Diagnosis not present

## 2019-08-19 DIAGNOSIS — R351 Nocturia: Secondary | ICD-10-CM | POA: Diagnosis not present

## 2019-08-21 DIAGNOSIS — M9903 Segmental and somatic dysfunction of lumbar region: Secondary | ICD-10-CM | POA: Diagnosis not present

## 2019-08-21 DIAGNOSIS — M545 Low back pain: Secondary | ICD-10-CM | POA: Diagnosis not present

## 2019-08-21 DIAGNOSIS — M9907 Segmental and somatic dysfunction of upper extremity: Secondary | ICD-10-CM | POA: Diagnosis not present

## 2019-08-21 DIAGNOSIS — M9905 Segmental and somatic dysfunction of pelvic region: Secondary | ICD-10-CM | POA: Diagnosis not present

## 2019-08-31 DIAGNOSIS — M9907 Segmental and somatic dysfunction of upper extremity: Secondary | ICD-10-CM | POA: Diagnosis not present

## 2019-08-31 DIAGNOSIS — M9903 Segmental and somatic dysfunction of lumbar region: Secondary | ICD-10-CM | POA: Diagnosis not present

## 2019-08-31 DIAGNOSIS — M545 Low back pain: Secondary | ICD-10-CM | POA: Diagnosis not present

## 2019-08-31 DIAGNOSIS — M9905 Segmental and somatic dysfunction of pelvic region: Secondary | ICD-10-CM | POA: Diagnosis not present

## 2019-09-08 DIAGNOSIS — L409 Psoriasis, unspecified: Secondary | ICD-10-CM | POA: Diagnosis not present

## 2019-09-08 DIAGNOSIS — Z79899 Other long term (current) drug therapy: Secondary | ICD-10-CM | POA: Diagnosis not present

## 2019-09-08 DIAGNOSIS — L4050 Arthropathic psoriasis, unspecified: Secondary | ICD-10-CM | POA: Diagnosis not present

## 2019-09-08 DIAGNOSIS — M79643 Pain in unspecified hand: Secondary | ICD-10-CM | POA: Diagnosis not present

## 2019-09-18 DIAGNOSIS — Z20822 Contact with and (suspected) exposure to covid-19: Secondary | ICD-10-CM | POA: Diagnosis not present

## 2019-10-02 DIAGNOSIS — L4050 Arthropathic psoriasis, unspecified: Secondary | ICD-10-CM | POA: Diagnosis not present

## 2019-12-01 DIAGNOSIS — L4050 Arthropathic psoriasis, unspecified: Secondary | ICD-10-CM | POA: Diagnosis not present

## 2019-12-09 DIAGNOSIS — Z23 Encounter for immunization: Secondary | ICD-10-CM | POA: Diagnosis not present

## 2019-12-09 DIAGNOSIS — L4050 Arthropathic psoriasis, unspecified: Secondary | ICD-10-CM | POA: Diagnosis not present

## 2019-12-09 DIAGNOSIS — M79643 Pain in unspecified hand: Secondary | ICD-10-CM | POA: Diagnosis not present

## 2019-12-09 DIAGNOSIS — Z79899 Other long term (current) drug therapy: Secondary | ICD-10-CM | POA: Diagnosis not present

## 2019-12-09 DIAGNOSIS — L409 Psoriasis, unspecified: Secondary | ICD-10-CM | POA: Diagnosis not present

## 2019-12-31 DIAGNOSIS — E039 Hypothyroidism, unspecified: Secondary | ICD-10-CM | POA: Diagnosis not present

## 2019-12-31 DIAGNOSIS — Z0000 Encounter for general adult medical examination without abnormal findings: Secondary | ICD-10-CM | POA: Diagnosis not present

## 2019-12-31 DIAGNOSIS — E782 Mixed hyperlipidemia: Secondary | ICD-10-CM | POA: Diagnosis not present

## 2019-12-31 DIAGNOSIS — I10 Essential (primary) hypertension: Secondary | ICD-10-CM | POA: Diagnosis not present

## 2019-12-31 DIAGNOSIS — D509 Iron deficiency anemia, unspecified: Secondary | ICD-10-CM | POA: Diagnosis not present

## 2019-12-31 DIAGNOSIS — L4050 Arthropathic psoriasis, unspecified: Secondary | ICD-10-CM | POA: Diagnosis not present

## 2020-01-01 ENCOUNTER — Other Ambulatory Visit: Payer: Self-pay | Admitting: Internal Medicine

## 2020-01-01 DIAGNOSIS — E7800 Pure hypercholesterolemia, unspecified: Secondary | ICD-10-CM

## 2020-01-20 ENCOUNTER — Ambulatory Visit
Admission: RE | Admit: 2020-01-20 | Discharge: 2020-01-20 | Disposition: A | Discharge status: Home / Self Care | Payer: BC Managed Care – PPO | Source: Ambulatory Visit | Attending: Internal Medicine | Admitting: Internal Medicine

## 2020-01-20 DIAGNOSIS — E7800 Pure hypercholesterolemia, unspecified: Secondary | ICD-10-CM

## 2020-01-26 DIAGNOSIS — L4050 Arthropathic psoriasis, unspecified: Secondary | ICD-10-CM | POA: Diagnosis not present

## 2020-03-10 DIAGNOSIS — Z79899 Other long term (current) drug therapy: Secondary | ICD-10-CM | POA: Diagnosis not present

## 2020-03-10 DIAGNOSIS — L409 Psoriasis, unspecified: Secondary | ICD-10-CM | POA: Diagnosis not present

## 2020-03-10 DIAGNOSIS — L4050 Arthropathic psoriasis, unspecified: Secondary | ICD-10-CM | POA: Diagnosis not present

## 2020-03-10 DIAGNOSIS — M79643 Pain in unspecified hand: Secondary | ICD-10-CM | POA: Diagnosis not present

## 2020-03-22 DIAGNOSIS — L4050 Arthropathic psoriasis, unspecified: Secondary | ICD-10-CM | POA: Diagnosis not present

## 2020-03-22 DIAGNOSIS — Z111 Encounter for screening for respiratory tuberculosis: Secondary | ICD-10-CM | POA: Diagnosis not present

## 2020-05-17 DIAGNOSIS — L4050 Arthropathic psoriasis, unspecified: Secondary | ICD-10-CM | POA: Diagnosis not present

## 2020-05-25 IMAGING — XA DG FLUORO GUIDE NDL PLC/BX
2 series · 2 of 2 positions shown · non-contrast
Comparison: none

CLINICAL DATA: Pain and limited range of motion. History of
psoriatic arthritis.

[Series 1: ortho standard · 1 of 1 slices shown (1 of 2)]
[im 1/1]
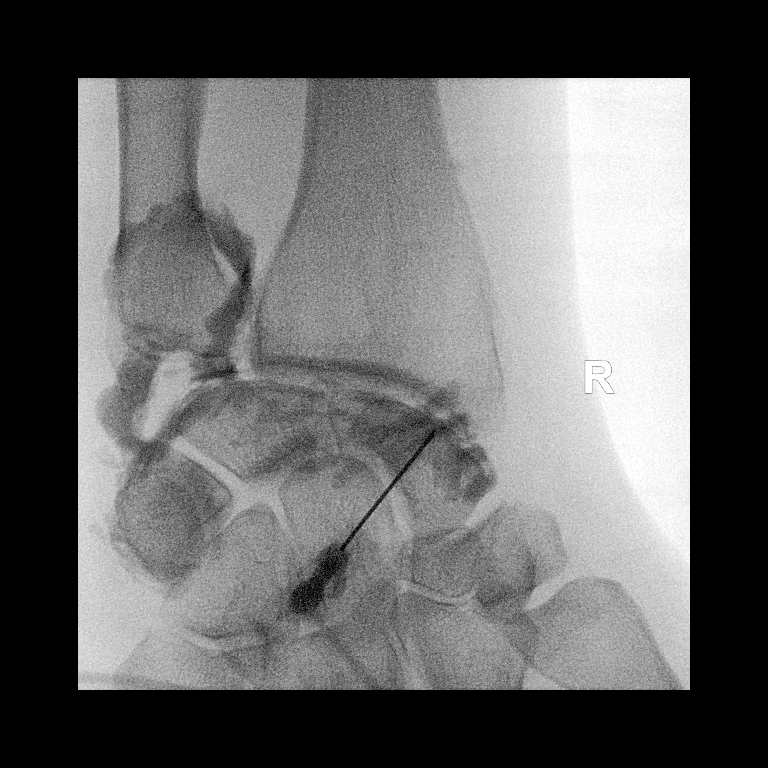

[Series 2: ortho standard · 1 of 1 slices shown (2 of 2)]
[im 1/1]
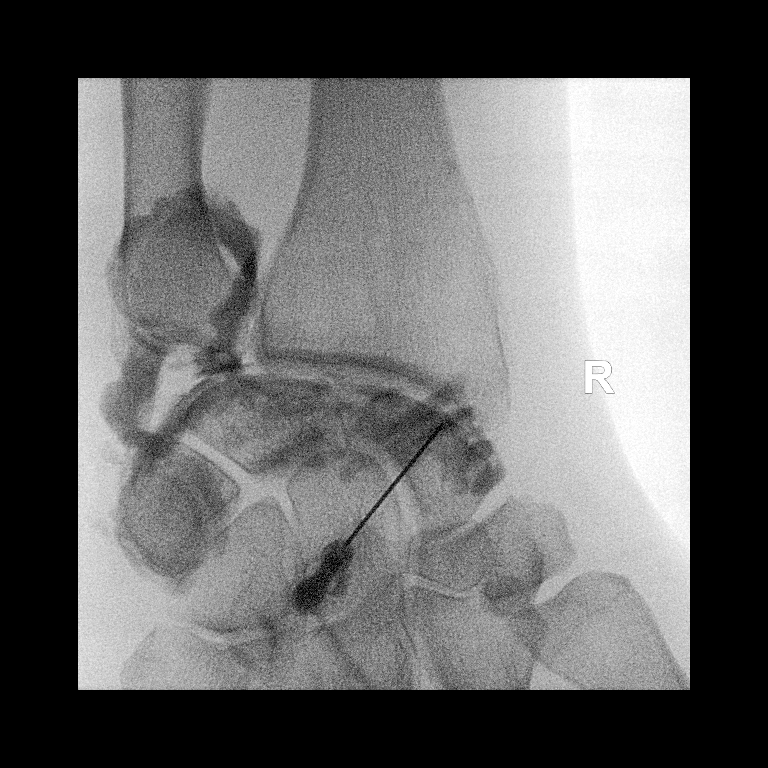

[2 of 2 positions shown; findings below may reference images not displayed]

FLUOROSCOPY TIME:  0 minutes 17 seconds. 0.58 micro gray meter
squared

PROCEDURE:
Right RADIOCARPAL JOINT INJECTION UNDER FLUOROSCOPY

The skin dorsal to the wrist was scrubbed with Betadine and draped
in sterile fashion. Skin anesthesia was carried out using!%
Lidocaine. A 25 [place] needle was directed into the radiocarpal joint
under flouroscopic guidance. 3cc of a mixture of 0.1 ml Multihance
and 10 ml of dilute Isovue 200 was then used to fill the radiocarpal
joint. Filming showed triangular fibrocartilage tear..
IMPRESSION: Technically successful right radiocarpal joint injection for MRI.
Triangular fibrocartilage tear demonstrated.

## 2020-05-31 DIAGNOSIS — Z124 Encounter for screening for malignant neoplasm of cervix: Secondary | ICD-10-CM | POA: Diagnosis not present

## 2020-05-31 DIAGNOSIS — Z01419 Encounter for gynecological examination (general) (routine) without abnormal findings: Secondary | ICD-10-CM | POA: Diagnosis not present

## 2020-07-04 DIAGNOSIS — N939 Abnormal uterine and vaginal bleeding, unspecified: Secondary | ICD-10-CM | POA: Diagnosis not present

## 2020-07-04 DIAGNOSIS — D219 Benign neoplasm of connective and other soft tissue, unspecified: Secondary | ICD-10-CM | POA: Diagnosis not present

## 2020-07-04 DIAGNOSIS — N9489 Other specified conditions associated with female genital organs and menstrual cycle: Secondary | ICD-10-CM | POA: Diagnosis not present

## 2020-07-05 DIAGNOSIS — R1013 Epigastric pain: Secondary | ICD-10-CM | POA: Diagnosis not present

## 2020-07-08 DIAGNOSIS — L4050 Arthropathic psoriasis, unspecified: Secondary | ICD-10-CM | POA: Diagnosis not present

## 2020-07-08 DIAGNOSIS — M79643 Pain in unspecified hand: Secondary | ICD-10-CM | POA: Diagnosis not present

## 2020-07-08 DIAGNOSIS — Z79899 Other long term (current) drug therapy: Secondary | ICD-10-CM | POA: Diagnosis not present

## 2020-07-08 DIAGNOSIS — L409 Psoriasis, unspecified: Secondary | ICD-10-CM | POA: Diagnosis not present

## 2020-07-12 DIAGNOSIS — L4050 Arthropathic psoriasis, unspecified: Secondary | ICD-10-CM | POA: Diagnosis not present

## 2020-07-14 DIAGNOSIS — N939 Abnormal uterine and vaginal bleeding, unspecified: Secondary | ICD-10-CM | POA: Diagnosis not present

## 2020-07-14 DIAGNOSIS — N9489 Other specified conditions associated with female genital organs and menstrual cycle: Secondary | ICD-10-CM | POA: Diagnosis not present

## 2020-07-14 DIAGNOSIS — D219 Benign neoplasm of connective and other soft tissue, unspecified: Secondary | ICD-10-CM | POA: Diagnosis not present

## 2020-08-09 DIAGNOSIS — S93491A Sprain of other ligament of right ankle, initial encounter: Secondary | ICD-10-CM | POA: Diagnosis not present

## 2020-09-02 DIAGNOSIS — N939 Abnormal uterine and vaginal bleeding, unspecified: Secondary | ICD-10-CM | POA: Diagnosis not present

## 2020-09-02 DIAGNOSIS — Z01818 Encounter for other preprocedural examination: Secondary | ICD-10-CM | POA: Diagnosis not present

## 2020-09-02 NOTE — H&P (Signed)
Chelsey Franco is an 50 y.o. perimenopausal G4P3 who is admitted for Hysteroscopy D&C with Myosure polypectomy and Mirena IUD placement for AUB and 1.5cm endometrial mass (suspected endometrial polyp).  Upon initial evaluation 05/2020, patient had reports having abnormal bleeding with intermenstrual spotting. After risks/benefits discussion, patient opts to have Mirena IUD placement to aid with heavy periods and to bridge her through menopause.  Work-up: TVUS (06/2020): Uterus 9.94 x 5.40 x 6.47cm, endometrial thickness 1.16cm. Fibroi 1: 2.19cm  Fibroid 2: 1.28cm R ovary 2.26cm Left ovary 2.43cm Anteverted uterus. Uterine fibroids - posterior 2.6 x 2.2 x 1.6cm, fundal 1.5 x 1.3 x 1.3cm. Endometrium thickened with hyperechoic mass - 1.5 x 1.3 x 0.9cm, no blood flow noted. Bilateral ovaries wnl. No adnexal masses seen.  EMB (06/2020): secretory endometrium, no hyperplasia/carcinoma  Pap smear (2022): NILM/HRHPV neg  CBC: H/H 12.7/38.3  TSH (12/2019): 2.67 (wnl)   Patient Active Problem List   Diagnosis Date Noted   Osteoarthritis 09/03/2020   Other long term (current) drug therapy 09/03/2020   Psoriatic arthritis (HCC) 09/03/2020   Abnormal uterine bleeding (AUB) 09/03/2020   Endometrial mass 09/03/2020   Low back pain 05/05/2019   Arthritis of carpometacarpal [facility]) joint of right thumb 06/17/2018   Degenerative tear of triangular fibrocartilage complex (TFCC) of right wrist 06/17/2018   Tear of left lunotriquetral ligament 06/17/2018   MEDICAL/FAMILY/SOCIAL HX: No LMP recorded.    Past Medical History:  Diagnosis Date   Arthritis    Depression    Hypertension    Hypothyroid    Past Surgical History:  Procedure Laterality Date   KNEE ARTHROSCOPY     NASAL SEPTOPLASTY W/ TURBINOPLASTY      No family history on file.  Social History:  reports current alcohol use. She reports that she does not use drugs. No history on file for tobacco use.  ALLERGIES/MEDS:  Allergies:   Allergies  Allergen Reactions   Penicillins Rash    No medications prior to admission.     Review of Systems  Constitutional: Negative.   HENT: Negative.    Eyes: Negative.   Respiratory: Negative.    Cardiovascular: Negative.   Gastrointestinal: Negative.   Genitourinary: Negative.   Musculoskeletal: Negative.   Skin: Negative.   Neurological: Negative.   Endo/Heme/Allergies: Negative.   Psychiatric/Behavioral: Negative.     There were no vitals taken for this visit. Gen:  NAD, pleasant and cooperative Cardio:  RRR Pulm:  CTAB, no wheezes/rales/rhonchi Abd:  Soft, non-distended, non-tender throughout, no rebound/guarding Ext:  No bilateral LE edema, no bilateral calf tenderness Pelvic: Labia - unremarkable, vagina - pink moist mucosa, no lesions or abnormal discharge, cervix - no lesions, discharge, or CMT, adnexa - no masses or tenderness, uterus - non-tender and normal size on palpation  No results found for this or any previous visit (from the past 24 hour(s)).  No results found.   ASSESSMENT/PLAN: Chelsey Franco is a 50 y.o. erimenopausal G4P3 who is admitted for Hysteroscopy D&C with Myosure polypectomy and Mirena IUD placement for AUB and 1.5cm endometrial mass (suspected endometrial polyp).  - Admit to [facility] outpatient - Admit labs (CBC, T&S, COVID screen) - Diet:  NPO - IVF:  Per patient request - VTE Prophylaxis:  SCDs - Antibiotics: None  Consents: I discussed with the patient that this surgery is performed to look inside the uterus and remove the uterine lining.  Prior to surgery, the risks and benefits of the surgery, as well as alternative treatments, have been discussed.  The risks include, but are not limited to bleeding, including the need for a blood transfusion, infection, damage to organs and tissues, including uterine perforation, requiring additional surgery, postoperative pain, short-term and long-term, failure of the procedure to control  symptoms, need for hysterectomy to control bleeding, fluid overload, which could create electrolyte abnormalities and the need to stop the procedure before completion, inability to safely complete the procedure, deep vein thrombosis and/or pulmonary embolism, painful intercourse, complications the course of which cannot be predicted or prevented, and death.  Reviewed risks/benefits of Mirena IUD device insertion. Discussed bleeding profile in full detail. Counseled on risk of expulsion, uterine perforation, or malposition. Patient was consented for blood products.  The patient is aware that bleeding may result in the need for a blood transfusion which includes risk of transmission of HIV (1:2 million), Hepatitis C (1:2 million), and Hepatitis B (1:200 thousand) and transfusion reaction.  Patient voiced understanding of the above risks as well as understanding of indications for blood transfusion.   Steva Ready, DO [phone] (office)

## 2020-09-03 DIAGNOSIS — L4050 Arthropathic psoriasis, unspecified: Secondary | ICD-10-CM | POA: Insufficient documentation

## 2020-09-03 DIAGNOSIS — N939 Abnormal uterine and vaginal bleeding, unspecified: Secondary | ICD-10-CM

## 2020-09-03 DIAGNOSIS — M1990 Unspecified osteoarthritis, unspecified site: Secondary | ICD-10-CM | POA: Insufficient documentation

## 2020-09-03 DIAGNOSIS — N9489 Other specified conditions associated with female genital organs and menstrual cycle: Secondary | ICD-10-CM

## 2020-09-03 DIAGNOSIS — Z79899 Other long term (current) drug therapy: Secondary | ICD-10-CM | POA: Insufficient documentation

## 2020-09-06 DIAGNOSIS — L4050 Arthropathic psoriasis, unspecified: Secondary | ICD-10-CM | POA: Diagnosis not present

## 2020-09-12 ENCOUNTER — Encounter (HOSPITAL_BASED_OUTPATIENT_CLINIC_OR_DEPARTMENT_OTHER): Payer: Self-pay | Admitting: Obstetrics and Gynecology

## 2020-09-12 ENCOUNTER — Other Ambulatory Visit: Payer: Self-pay

## 2020-09-12 NOTE — Progress Notes (Signed)
Spoke w/ via phone for pre-op interview---pt Lab needs dos----   Istat and EKG            Lab results------n/a COVID test -----patient states asymptomatic no test needed Arrive at -------noon on 09/14/2020 NPO after MN NO Solid Food.  Clear liquids from MN until---11 09/14/2020  Med rec completed Medications to take morning of surgery -----celexa, Norvasc, synthroid and protonix Diabetic medication -----n/a Patient instructed no nail polish to be worn day of surgery Patient instructed to bring photo id and insurance card day of surgery Patient aware to have Driver (ride ) / caregiver husband Chelsey Franco    for 24 hours after surgery  Patient Special Instructions -----hold her telmisartan day surgery Pre-Op special Istructions ----- Patient verbalized understanding of instructions that were given at this phone interview. Patient denies shortness of breath, chest pain, fever, cough at this phone interview.

## 2020-09-14 ENCOUNTER — Other Ambulatory Visit: Payer: Self-pay

## 2020-09-14 ENCOUNTER — Encounter (HOSPITAL_BASED_OUTPATIENT_CLINIC_OR_DEPARTMENT_OTHER)
Admission: RE | Disposition: A | Discharge status: Home / Self Care | Payer: Self-pay | Source: Home / Self Care | Attending: Obstetrics and Gynecology

## 2020-09-14 ENCOUNTER — Ambulatory Visit (HOSPITAL_BASED_OUTPATIENT_CLINIC_OR_DEPARTMENT_OTHER)
Admission: RE | Admit: 2020-09-14 | Discharge: 2020-09-14 | Disposition: A | Discharge status: Home / Self Care | Payer: BC Managed Care – PPO | Attending: Obstetrics and Gynecology | Admitting: Obstetrics and Gynecology

## 2020-09-14 ENCOUNTER — Ambulatory Visit (HOSPITAL_BASED_OUTPATIENT_CLINIC_OR_DEPARTMENT_OTHER): Payer: BC Managed Care – PPO | Admitting: Certified Registered Nurse Anesthetist

## 2020-09-14 ENCOUNTER — Encounter (HOSPITAL_BASED_OUTPATIENT_CLINIC_OR_DEPARTMENT_OTHER): Payer: Self-pay | Admitting: Obstetrics and Gynecology

## 2020-09-14 DIAGNOSIS — N939 Abnormal uterine and vaginal bleeding, unspecified: Secondary | ICD-10-CM

## 2020-09-14 DIAGNOSIS — Z880 Allergy status to penicillin: Secondary | ICD-10-CM | POA: Diagnosis not present

## 2020-09-14 DIAGNOSIS — N840 Polyp of corpus uteri: Secondary | ICD-10-CM | POA: Insufficient documentation

## 2020-09-14 DIAGNOSIS — N9489 Other specified conditions associated with female genital organs and menstrual cycle: Secondary | ICD-10-CM

## 2020-09-14 DIAGNOSIS — I10 Essential (primary) hypertension: Secondary | ICD-10-CM | POA: Diagnosis not present

## 2020-09-14 DIAGNOSIS — Z30430 Encounter for insertion of intrauterine contraceptive device: Secondary | ICD-10-CM | POA: Insufficient documentation

## 2020-09-14 DIAGNOSIS — E039 Hypothyroidism, unspecified: Secondary | ICD-10-CM | POA: Diagnosis not present

## 2020-09-14 DIAGNOSIS — M1811 Unilateral primary osteoarthritis of first carpometacarpal joint, right hand: Secondary | ICD-10-CM | POA: Diagnosis not present

## 2020-09-14 HISTORY — DX: Anemia, unspecified: D64.9

## 2020-09-14 HISTORY — PX: INTRAUTERINE DEVICE (IUD) INSERTION: SHX5877

## 2020-09-14 HISTORY — PX: DILATATION & CURETTAGE/HYSTEROSCOPY WITH MYOSURE: SHX6511

## 2020-09-14 HISTORY — DX: Sleep apnea, unspecified: G47.30

## 2020-09-14 LAB — CBC
HCT: 36.4 % (ref 36.0–46.0)
Hemoglobin: 12.4 g/dL (ref 12.0–15.0)
MCH: 33.2 pg (ref 26.0–34.0)
MCHC: 34.1 g/dL (ref 30.0–36.0)
MCV: 97.6 fL (ref 80.0–100.0)
Platelets: 385 10*3/uL (ref 150–400)
RBC: 3.73 MIL/uL — ABNORMAL LOW (ref 3.87–5.11)
RDW: 13.4 % (ref 11.5–15.5)
WBC: 7.4 10*3/uL (ref 4.0–10.5)
nRBC: 0 % (ref 0.0–0.2)

## 2020-09-14 LAB — POCT I-STAT, CHEM 8
BUN: 16 mg/dL (ref 6–20)
Calcium, Ion: 1.13 mmol/L — ABNORMAL LOW (ref 1.15–1.40)
Chloride: 104 mmol/L (ref 98–111)
Creatinine, Ser: 0.7 mg/dL (ref 0.44–1.00)
Glucose, Bld: 89 mg/dL (ref 70–99)
HCT: 38 % (ref 36.0–46.0)
Hemoglobin: 12.9 g/dL (ref 12.0–15.0)
Potassium: 4.8 mmol/L (ref 3.5–5.1)
Sodium: 137 mmol/L (ref 135–145)
TCO2: 28 mmol/L (ref 22–32)

## 2020-09-14 LAB — ABO/RH: ABO/RH(D): O POS

## 2020-09-14 LAB — TYPE AND SCREEN
ABO/RH(D): O POS
Antibody Screen: NEGATIVE

## 2020-09-14 LAB — POCT PREGNANCY, URINE: Preg Test, Ur: NEGATIVE

## 2020-09-14 SURGERY — DILATATION & CURETTAGE/HYSTEROSCOPY WITH MYOSURE
Anesthesia: General | Site: Vagina

## 2020-09-14 MED ORDER — SODIUM CHLORIDE 0.9 % IR SOLN
Status: DC | PRN
  Administered 2020-09-14: 3000 mL

## 2020-09-14 MED ORDER — FENTANYL CITRATE (PF) 100 MCG/2ML IJ SOLN
INTRAMUSCULAR | Status: DC | PRN
  Administered 2020-09-14: 100 ug via INTRAVENOUS
  Administered 2020-09-14: 50 ug via INTRAVENOUS

## 2020-09-14 MED ORDER — LEVONORGESTREL 20 MCG/DAY IU IUD
1.0000 | INTRAUTERINE_SYSTEM | INTRAUTERINE | Status: AC
  Administered 2020-09-14: 1 via INTRAUTERINE

## 2020-09-14 MED ORDER — FENTANYL CITRATE (PF) 100 MCG/2ML IJ SOLN
INTRAMUSCULAR | Status: AC
  Filled 2020-09-14: qty 2

## 2020-09-14 MED ORDER — KETOROLAC TROMETHAMINE 30 MG/ML IJ SOLN
INTRAMUSCULAR | Status: DC | PRN
  Administered 2020-09-14: 30 mg via INTRAVENOUS

## 2020-09-14 MED ORDER — DEXAMETHASONE SODIUM PHOSPHATE 10 MG/ML IJ SOLN
INTRAMUSCULAR | Status: DC | PRN
  Administered 2020-09-14: 10 mg via INTRAVENOUS

## 2020-09-14 MED ORDER — PROPOFOL 10 MG/ML IV BOLUS
INTRAVENOUS | Status: DC | PRN
  Administered 2020-09-14: 200 mg via INTRAVENOUS

## 2020-09-14 MED ORDER — ONDANSETRON HCL 4 MG/2ML IJ SOLN
INTRAMUSCULAR | Status: AC
  Filled 2020-09-14: qty 2

## 2020-09-14 MED ORDER — EPHEDRINE SULFATE-NACL 50-0.9 MG/10ML-% IV SOSY
PREFILLED_SYRINGE | INTRAVENOUS | Status: DC | PRN
  Administered 2020-09-14: 5 mg via INTRAVENOUS
  Administered 2020-09-14: 10 mg via INTRAVENOUS

## 2020-09-14 MED ORDER — LEVONORGESTREL 20 MCG/DAY IU IUD: 1.0000 | INTRAUTERINE_SYSTEM | Freq: Once | INTRAUTERINE | Status: DC

## 2020-09-14 MED ORDER — DEXAMETHASONE SODIUM PHOSPHATE 10 MG/ML IJ SOLN
INTRAMUSCULAR | Status: AC
  Filled 2020-09-14: qty 1

## 2020-09-14 MED ORDER — LIDOCAINE 2% (20 MG/ML) 5 ML SYRINGE
INTRAMUSCULAR | Status: DC | PRN
  Administered 2020-09-14: 100 mg via INTRAVENOUS

## 2020-09-14 MED ORDER — LEVONORGESTREL 20 MCG/DAY IU IUD
INTRAUTERINE_SYSTEM | INTRAUTERINE | Status: AC
  Filled 2020-09-14: qty 1

## 2020-09-14 MED ORDER — IBUPROFEN 800 MG PO TABS: 800.0000 mg | ORAL_TABLET | Freq: Three times a day (TID) | ORAL | 0 refills | Status: DC | PRN

## 2020-09-14 MED ORDER — FENTANYL CITRATE (PF) 100 MCG/2ML IJ SOLN: 25.0000 ug | INTRAMUSCULAR | Status: DC | PRN

## 2020-09-14 MED ORDER — MIDAZOLAM HCL 2 MG/2ML IJ SOLN
INTRAMUSCULAR | Status: AC
  Filled 2020-09-14: qty 2

## 2020-09-14 MED ORDER — EPHEDRINE 5 MG/ML INJ
INTRAVENOUS | Status: AC
  Filled 2020-09-14: qty 5

## 2020-09-14 MED ORDER — SILVER NITRATE-POT NITRATE 75-25 % EX MISC
CUTANEOUS | Status: DC | PRN
  Administered 2020-09-14: 2

## 2020-09-14 MED ORDER — PROPOFOL 10 MG/ML IV BOLUS
INTRAVENOUS | Status: AC
  Filled 2020-09-14: qty 20

## 2020-09-14 MED ORDER — KETOROLAC TROMETHAMINE 30 MG/ML IJ SOLN
INTRAMUSCULAR | Status: AC
  Filled 2020-09-14: qty 1

## 2020-09-14 MED ORDER — MIDAZOLAM HCL 5 MG/5ML IJ SOLN
INTRAMUSCULAR | Status: DC | PRN
  Administered 2020-09-14: 2 mg via INTRAVENOUS

## 2020-09-14 MED ORDER — ONDANSETRON HCL 4 MG/2ML IJ SOLN
INTRAMUSCULAR | Status: DC | PRN
Start: 2020-09-14 — End: 2020-09-14
  Administered 2020-09-14: 4 mg via INTRAVENOUS

## 2020-09-14 MED ORDER — LACTATED RINGERS IV SOLN: INTRAVENOUS | Status: DC

## 2020-09-14 SURGICAL SUPPLY — 16 items
CATH ROBINSON RED A/P 16FR (CATHETERS) ×3 IMPLANT
DEVICE MYOSURE REACH (MISCELLANEOUS) ×3 IMPLANT
GAUZE 4X4 16PLY ~~LOC~~+RFID DBL (SPONGE) ×3 IMPLANT
GLOVE SURG ENC MOIS LTX SZ6.5 (GLOVE) ×3 IMPLANT
GLOVE SURG ENC TEXT LTX SZ6.5 (GLOVE) ×3 IMPLANT
GLOVE SURG UNDER POLY LF SZ7 (GLOVE) ×12 IMPLANT
GOWN STRL REUS W/TWL LRG LVL3 (GOWN DISPOSABLE) ×9 IMPLANT
KIT PROCEDURE FLUENT (KITS) ×3 IMPLANT
KIT TURNOVER CYSTO (KITS) ×3 IMPLANT
MIRENA  IUD ×3 IMPLANT
PACK VAGINAL MINOR WOMEN LF (CUSTOM PROCEDURE TRAY) ×3 IMPLANT
PAD OB MATERNITY 4.3X12.25 (PERSONAL CARE ITEMS) ×3 IMPLANT
SEAL ROD LENS SCOPE MYOSURE (ABLATOR) ×3 IMPLANT
SOL PREP POV-IOD 4OZ 10% (MISCELLANEOUS) ×3 IMPLANT
TOWEL OR 17X26 10 PK STRL BLUE (TOWEL DISPOSABLE) ×3 IMPLANT
UNDERPAD 30X36 HEAVY ABSORB (UNDERPADS AND DIAPERS) ×3 IMPLANT

## 2020-09-14 NOTE — Discharge Instructions (Signed)

## 2020-09-14 NOTE — Anesthesia Postprocedure Evaluation (Signed)
Anesthesia Post Note  Patient: Chelsey Franco  Procedure(s) Performed: DILATATION & CURETTAGE/HYSTEROSCOPY WITH MYOSURE (Vagina ) INTRAUTERINE DEVICE (IUD) INSERTION     Patient location during evaluation: PACU Anesthesia Type: General Level of consciousness: awake Pain management: pain level controlled Vital Signs Assessment: post-procedure vital signs reviewed and stable Cardiovascular status: stable Postop Assessment: no apparent nausea or vomiting Anesthetic complications: no   No notable events documented.  Last Vitals:  Vitals:   09/14/20 1203 09/14/20 1515  BP: (!) 151/85 134/75  Pulse: 83 (!) 108  Resp: 17 15  Temp: 37 C 36.8 C  SpO2: 97% 95%    Last Pain:  Vitals:   09/14/20 1515  TempSrc:   PainSc: 0-No pain                 Correna Meacham

## 2020-09-14 NOTE — Anesthesia Procedure Notes (Signed)
Procedure Name: LMA Insertion Date/Time: 09/14/2020 2:43 PM Performed by: Bishop Limbo, CRNA Pre-anesthesia Checklist: Patient identified, Emergency Drugs available, Suction available and Patient being monitored Patient Re-evaluated:Patient Re-evaluated prior to induction Oxygen Delivery Method: Circle System Utilized Preoxygenation: Pre-oxygenation with 100% oxygen Induction Type: IV induction Ventilation: Mask ventilation without difficulty LMA: LMA inserted LMA Size: 4.0 Number of attempts: 1 Placement Confirmation: positive ETCO2 Tube secured with: Tape Dental Injury: Teeth and Oropharynx as per pre-operative assessment

## 2020-09-14 NOTE — Anesthesia Preprocedure Evaluation (Signed)
Anesthesia Evaluation  Patient identified by MRN, date of birth, ID band Patient awake    Reviewed: Allergy & Precautions, NPO status   Airway Mallampati: II  TM Distance: >3 FB     Dental   Pulmonary    breath sounds clear to auscultation       Cardiovascular hypertension,  Rhythm:Regular Rate:Normal     Neuro/Psych    GI/Hepatic negative GI ROS, Neg liver ROS,   Endo/Other  Hypothyroidism   Renal/GU negative Renal ROS     Musculoskeletal   Abdominal   Peds  Hematology   Anesthesia Other Findings   Reproductive/Obstetrics                             Anesthesia Physical Anesthesia Plan  ASA: 2  Anesthesia Plan: General   Post-op Pain Management:    Induction:   PONV Risk Score and Plan: 3 and Ondansetron, Dexamethasone and Midazolam  Airway Management Planned: LMA  Additional Equipment:   Intra-op Plan:   Post-operative Plan: Extubation in OR  Informed Consent:     Dental advisory given  Plan Discussed with: CRNA and Anesthesiologist  Anesthesia Plan Comments:         Anesthesia Quick Evaluation

## 2020-09-14 NOTE — Transfer of Care (Signed)
Immediate Anesthesia Transfer of Care Note  Patient: Chelsey Franco  Procedure(s) Performed: DILATATION & CURETTAGE/HYSTEROSCOPY WITH MYOSURE (Vagina ) INTRAUTERINE DEVICE (IUD) INSERTION  Patient Location: PACU  Anesthesia Type:General  Level of Consciousness: awake, alert , oriented and patient cooperative  Airway & Oxygen Therapy: Patient Spontanous Breathing  Post-op Assessment: Report given to RN and Post -op Vital signs reviewed and stable  Post vital signs: Reviewed and stable  Last Vitals:  Vitals Value Taken Time  BP 134/75 09/14/20 1516  Temp    Pulse 111 09/14/20 1517  Resp 15 09/14/20 1517  SpO2 96 % 09/14/20 1517  Vitals shown include unvalidated device data.  Last Pain:  Vitals:   09/14/20 1203  TempSrc: Oral  PainSc: 0-No pain         Complications: No notable events documented.

## 2020-09-14 NOTE — Interval H&P Note (Signed)
History and Physical Interval Note:  09/14/2020 1:31 PM  Chelsey Franco  has presented today for surgery, with the diagnosis of Abnormal Uterine Bleeding.  The various methods of treatment have been discussed with the patient and family. After consideration of risks, benefits and other options for treatment, the patient has consented to  Procedure(s): DILATATION & CURETTAGE/HYSTEROSCOPY WITH MYOSURE (N/A) INTRAUTERINE DEVICE (IUD) INSERTION (N/A) as a surgical intervention.  The patient's history has been reviewed, patient examined, no change in status, stable for surgery.  I have reviewed the patient's chart and labs.  Questions were answered to the patient's satisfaction.     Steva Ready

## 2020-09-14 NOTE — Op Note (Signed)
Pre Op Dx:   1. Abnormal uterine bleeding 2. Endometrial mass 1.5cm on ultrasound 3. Desires Mirena Intrauterine device placement  Post Op Dx:   1. Abnormal uterine bleeding 2. Endometrial polyps 3. Desires Mirena Intrauterine device placement  Procedure:   1. Hysteroscopy with Dilation and Curettage 2. Myosure Polypectomy 3. Placement of Mirena IUD   Surgeon:  Dr. Steva Ready Assistants:  None Anesthesia:  General   EBL:  10cc  IVF:  800cc UOP:  200cc via in-and-out catheter Fluid Deficit:  110cc  Drains:  In-and-out foley catheter Specimen removed:  Endometrial curettings and endometrial polyps - sent to pathology Device(s) implanted: Mirena IUD  Lot No: [id number]  Expiration: 10/2022 Case Type:  Clean-contaminated Findings:  Enlarged cervix with large Nabothian cyst on anterior lip of cervix. Multiple broad-based endometrial polyps noted and proliferative polypoid tissue present. Bilateral tubal ostia visualized. Uterus sounded to 9cm. Complications: None Indications:  50 y.o. G4P3 with abnormal uterine bleeding, endometrial mass on [place] (suspected endometrial polyp) who desired resection of the mass and placement of IUD. Description of each procedure:  After informed consent was obtained the patient was taken to the operating room in the dorsal supine position.  After administration of general anesthesia, the patient was placed in the dorsal lithotomy position and prepped and draped in the usual sterile fashion.  Her bladder was emptied using an in and out catheter.  A pre-operative time-out was completed.  The anterior lip of the cervix was grasped with a single-tooth tenaculum and the cervix was serially dilated to accommodate the hysteroscope.  The hysteroscope was advanced and the findings as above was noted. The Myosure Reach was used to resect the endometrial polyps and polypoid tissue.  A sharp banjo curette was used to curettage the endometrium. The Mirena IUD was inserted  in standard fashion and strings were trimmed to ~3cm.The single-tooth tenaculum was removed and its sites were made hemostatic with silver nitrate and pressure.  Adequate hemostasis was noted.  The patient was awakened and extubated and appeared to have tolerated the procedure well.  All counts were correct. Disposition:  PACU  Steva Ready, DO

## 2020-09-15 ENCOUNTER — Encounter (HOSPITAL_BASED_OUTPATIENT_CLINIC_OR_DEPARTMENT_OTHER): Payer: Self-pay | Admitting: Obstetrics and Gynecology

## 2020-09-15 LAB — SURGICAL PATHOLOGY

## 2020-09-28 DIAGNOSIS — Z4889 Encounter for other specified surgical aftercare: Secondary | ICD-10-CM | POA: Diagnosis not present

## 2020-11-01 DIAGNOSIS — Z30431 Encounter for routine checking of intrauterine contraceptive device: Secondary | ICD-10-CM | POA: Diagnosis not present

## 2020-11-03 DIAGNOSIS — L4050 Arthropathic psoriasis, unspecified: Secondary | ICD-10-CM | POA: Diagnosis not present

## 2020-12-05 ENCOUNTER — Other Ambulatory Visit: Payer: Self-pay | Admitting: Physician Assistant

## 2020-12-05 ENCOUNTER — Ambulatory Visit
Admission: RE | Admit: 2020-12-05 | Discharge: 2020-12-05 | Disposition: A | Discharge status: Home / Self Care | Payer: BC Managed Care – PPO | Source: Ambulatory Visit | Attending: Physician Assistant | Admitting: Physician Assistant

## 2020-12-05 DIAGNOSIS — R0609 Other forms of dyspnea: Secondary | ICD-10-CM

## 2020-12-05 DIAGNOSIS — R059 Cough, unspecified: Secondary | ICD-10-CM | POA: Diagnosis not present

## 2020-12-05 DIAGNOSIS — H66003 Acute suppurative otitis media without spontaneous rupture of ear drum, bilateral: Secondary | ICD-10-CM | POA: Diagnosis not present

## 2020-12-14 DIAGNOSIS — M25562 Pain in left knee: Secondary | ICD-10-CM | POA: Diagnosis not present

## 2020-12-14 DIAGNOSIS — M25561 Pain in right knee: Secondary | ICD-10-CM | POA: Diagnosis not present

## 2020-12-14 DIAGNOSIS — Z79899 Other long term (current) drug therapy: Secondary | ICD-10-CM | POA: Diagnosis not present

## 2020-12-14 DIAGNOSIS — L4050 Arthropathic psoriasis, unspecified: Secondary | ICD-10-CM | POA: Diagnosis not present

## 2020-12-14 DIAGNOSIS — L409 Psoriasis, unspecified: Secondary | ICD-10-CM | POA: Diagnosis not present

## 2020-12-14 DIAGNOSIS — M79643 Pain in unspecified hand: Secondary | ICD-10-CM | POA: Diagnosis not present

## 2020-12-20 DIAGNOSIS — R0609 Other forms of dyspnea: Secondary | ICD-10-CM | POA: Diagnosis not present

## 2020-12-20 DIAGNOSIS — R5383 Other fatigue: Secondary | ICD-10-CM | POA: Diagnosis not present

## 2020-12-29 DIAGNOSIS — L4050 Arthropathic psoriasis, unspecified: Secondary | ICD-10-CM | POA: Diagnosis not present

## 2021-01-06 DIAGNOSIS — Z0000 Encounter for general adult medical examination without abnormal findings: Secondary | ICD-10-CM | POA: Diagnosis not present

## 2021-01-06 DIAGNOSIS — Z23 Encounter for immunization: Secondary | ICD-10-CM | POA: Diagnosis not present

## 2021-01-06 DIAGNOSIS — E782 Mixed hyperlipidemia: Secondary | ICD-10-CM | POA: Diagnosis not present

## 2021-01-06 DIAGNOSIS — I10 Essential (primary) hypertension: Secondary | ICD-10-CM | POA: Diagnosis not present

## 2021-01-06 DIAGNOSIS — F418 Other specified anxiety disorders: Secondary | ICD-10-CM | POA: Diagnosis not present

## 2021-01-06 DIAGNOSIS — E039 Hypothyroidism, unspecified: Secondary | ICD-10-CM | POA: Diagnosis not present

## 2021-02-06 DIAGNOSIS — G4733 Obstructive sleep apnea (adult) (pediatric): Secondary | ICD-10-CM | POA: Diagnosis not present

## 2021-02-23 DIAGNOSIS — L4050 Arthropathic psoriasis, unspecified: Secondary | ICD-10-CM | POA: Diagnosis not present

## 2021-03-06 DIAGNOSIS — M5459 Other low back pain: Secondary | ICD-10-CM | POA: Diagnosis not present

## 2021-03-10 DIAGNOSIS — M545 Low back pain, unspecified: Secondary | ICD-10-CM | POA: Diagnosis not present

## 2021-03-16 DIAGNOSIS — M5459 Other low back pain: Secondary | ICD-10-CM | POA: Diagnosis not present

## 2021-03-24 DIAGNOSIS — M545 Low back pain, unspecified: Secondary | ICD-10-CM | POA: Diagnosis not present

## 2021-04-07 DIAGNOSIS — M545 Low back pain, unspecified: Secondary | ICD-10-CM | POA: Diagnosis not present

## 2021-04-13 DIAGNOSIS — L409 Psoriasis, unspecified: Secondary | ICD-10-CM | POA: Diagnosis not present

## 2021-04-13 DIAGNOSIS — Z79899 Other long term (current) drug therapy: Secondary | ICD-10-CM | POA: Diagnosis not present

## 2021-04-13 DIAGNOSIS — M79643 Pain in unspecified hand: Secondary | ICD-10-CM | POA: Diagnosis not present

## 2021-04-13 DIAGNOSIS — L4050 Arthropathic psoriasis, unspecified: Secondary | ICD-10-CM | POA: Diagnosis not present

## 2021-04-20 DIAGNOSIS — L4050 Arthropathic psoriasis, unspecified: Secondary | ICD-10-CM | POA: Diagnosis not present

## 2021-04-21 DIAGNOSIS — J019 Acute sinusitis, unspecified: Secondary | ICD-10-CM | POA: Diagnosis not present

## 2021-04-25 DIAGNOSIS — M25572 Pain in left ankle and joints of left foot: Secondary | ICD-10-CM | POA: Diagnosis not present

## 2021-05-18 DIAGNOSIS — G44209 Tension-type headache, unspecified, not intractable: Secondary | ICD-10-CM | POA: Diagnosis not present

## 2021-06-02 DIAGNOSIS — Z01419 Encounter for gynecological examination (general) (routine) without abnormal findings: Secondary | ICD-10-CM | POA: Diagnosis not present

## 2021-06-15 DIAGNOSIS — L4050 Arthropathic psoriasis, unspecified: Secondary | ICD-10-CM | POA: Diagnosis not present

## 2021-06-23 DIAGNOSIS — E669 Obesity, unspecified: Secondary | ICD-10-CM | POA: Diagnosis not present

## 2021-06-23 DIAGNOSIS — Z6834 Body mass index (BMI) 34.0-34.9, adult: Secondary | ICD-10-CM | POA: Diagnosis not present

## 2021-06-27 DIAGNOSIS — L0390 Cellulitis, unspecified: Secondary | ICD-10-CM | POA: Diagnosis not present

## 2021-06-27 DIAGNOSIS — T63441A Toxic effect of venom of bees, accidental (unintentional), initial encounter: Secondary | ICD-10-CM | POA: Diagnosis not present

## 2021-07-05 DIAGNOSIS — G4733 Obstructive sleep apnea (adult) (pediatric): Secondary | ICD-10-CM | POA: Diagnosis not present

## 2021-07-10 DIAGNOSIS — Z8711 Personal history of peptic ulcer disease: Secondary | ICD-10-CM | POA: Diagnosis not present

## 2021-07-10 DIAGNOSIS — K219 Gastro-esophageal reflux disease without esophagitis: Secondary | ICD-10-CM | POA: Diagnosis not present

## 2021-07-10 DIAGNOSIS — I10 Essential (primary) hypertension: Secondary | ICD-10-CM | POA: Diagnosis not present

## 2021-07-10 DIAGNOSIS — F325 Major depressive disorder, single episode, in full remission: Secondary | ICD-10-CM | POA: Diagnosis not present

## 2021-07-13 DIAGNOSIS — L4050 Arthropathic psoriasis, unspecified: Secondary | ICD-10-CM | POA: Diagnosis not present

## 2021-08-14 DIAGNOSIS — Z13820 Encounter for screening for osteoporosis: Secondary | ICD-10-CM | POA: Diagnosis not present

## 2021-08-14 DIAGNOSIS — M79643 Pain in unspecified hand: Secondary | ICD-10-CM | POA: Diagnosis not present

## 2021-08-14 DIAGNOSIS — Z79899 Other long term (current) drug therapy: Secondary | ICD-10-CM | POA: Diagnosis not present

## 2021-08-14 DIAGNOSIS — L4050 Arthropathic psoriasis, unspecified: Secondary | ICD-10-CM | POA: Diagnosis not present

## 2021-08-14 DIAGNOSIS — L409 Psoriasis, unspecified: Secondary | ICD-10-CM | POA: Diagnosis not present

## 2021-08-17 DIAGNOSIS — Z23 Encounter for immunization: Secondary | ICD-10-CM | POA: Diagnosis not present

## 2021-08-27 NOTE — Progress Notes (Signed)
New Patient Note  RE: Chelsey Franco MRN: [id number] DOB: 05-13-1970 Date of Office Visit: 08/28/2021  Consult requested by: Collene Mares, PA Primary care provider: Kirby Funk, MD  Chief Complaint: Establish Care  History of Present Illness: I had the pleasure of seeing [name] for initial evaluation at the [facility] on 08/28/2021. She is a 51 y.o. female, who is referred here by Kirby Funk, MD for the evaluation of bee allergy and drug allergy.  Bee allergy Patient got stung on the right side below her eyebrow a few months ago.  She developed right eye swelling fairly quickly which led to cellulitis requiring antibiotics.  Denies any other associated symptoms.   This was the first time she got stung by bees in a very long time. She got stung by most likely wasps in the past with no issues.   Patient's husband just started to keep bees this spring and patient helps out sometimes.   Drug reaction: Reaction to penicillin which occurred at age 58.  Patient was being treated for unknown infection. Symptoms started after taking unknown amount of time. Symptoms include: hives Symptoms persisted for unknown amount of time.  Treatment included: unknown. Mucosal involvement: no.  Previous work up includes: none. Previous history of rash/hives: no. Previous history of drug reactions: no.  Patient took amoxicillin as an adult with no issues.   Assessment and Plan: Gibson is a 51 y.o. 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 Husband is keeping bees and patient helps out. No issues with prior stings. Denies any other symptoms.  Large localized reactions are very common after hymenoptera stings.  See handout. Get bloodwork and if significantly positive will offer VIT.  If negative then will continue to monitor symptoms.  If symptoms worsen then will  recommend skin testing next.  No indication for Epipen given no systemic symptoms.  For mild symptoms you can take over the counter antihistamines such as Benadryl and monitor symptoms closely. If symptoms worsen or if you have severe symptoms including breathing issues, throat closure, significant swelling, whole body hives, severe diarrhea and vomiting, lightheadedness then seek immediate medical care.  History of penicillin allergy Hives as a young child after taking penicillin. She has apparently taken amoxicillin and other "cillin" medications in her adulthood with no reactions.  Discussed with patient that no indication for any testing for penicillin as penicillin and amoxicillin fall into the same family of medication. Will remove penicillin from allergy list.  Your risk of developing penicillin allergy in the future is the same as the general population's.  May take penicillin type of antibiotics in the future if needed.   Return if symptoms worsen or fail to improve.  No orders of the defined types were placed in this encounter.  Lab Orders         Allergen Hymenoptera Panel         Tryptase      Other allergy screening: Asthma: no Rhino conjunctivitis:  Minimal symptoms and takes OTC antihistamines prn with good benefit in the spring and fall.  Food allergy: no Urticaria: no Eczema:no History of recurrent infections suggestive of immunodeficency: no  Diagnostics: None.  Past Medical History: Patient Active Problem List   Diagnosis Date Noted   Allergic rhinitis 08/28/2021   History of penicillin allergy 08/28/2021   Anxiety 08/28/2021   Daytime somnolence 08/28/2021   Essential hypertension 08/28/2021  Gastroesophageal reflux disease 08/28/2021   Hypothyroidism 08/28/2021   Iron deficiency anemia 08/28/2021   Interstitial cystitis 08/28/2021   Major depressive disorder, single episode, in full remission (HCC) 08/28/2021   Menopausal and female climacteric  states 08/28/2021   Muscle contraction headache 08/28/2021   Obesity 08/28/2021   Obstructive sleep apnea (adult) (pediatric) 08/28/2021   Other specified conditions associated with female genital organs and menstrual cycle 08/28/2021   Mixed hyperlipidemia 08/28/2021   Pure hypercholesterolemia 08/28/2021   Reactive depression (situational) 08/28/2021   Recurrent sinusitis 08/28/2021   Restless legs syndrome 08/28/2021   Simple chronic bronchitis (HCC) 08/28/2021   Stress 08/28/2021   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 08/28/2021   Osteoarthritis 09/03/2020   Other long term (current) drug therapy 09/03/2020   Psoriatic arthritis (HCC) 09/03/2020   Abnormal uterine bleeding (AUB) 09/03/2020   Endometrial mass 09/03/2020   Low back pain 05/05/2019   Arthritis of carpometacarpal (CMC) joint of right thumb 06/17/2018   Degenerative tear of triangular fibrocartilage complex (TFCC) of right wrist 06/17/2018   Tear of left lunotriquetral ligament 06/17/2018   Past Medical History:  Diagnosis Date   Anemia    has had for few yrs takes iron 09/12/2020   Arthritis    soriatic 09/12/2020   Depression    has had it for 20 yrs 09/12/2020   Hypertension    Hypothyroid    Sleep apnea    wears a mouth gua 08/22/2022rd   Past Surgical History: Past Surgical History:  Procedure Laterality Date   DILATATION & CURETTAGE/HYSTEROSCOPY WITH MYOSURE N/A 09/14/2020   Procedure: DILATATION & CURETTAGE/HYSTEROSCOPY WITH MYOSURE;  Surgeon: Steva Ready, DO;  Location: [facility];  Service: Gynecology;  Laterality: N/A;   INTRAUTERINE DEVICE (IUD) INSERTION N/A 09/14/2020   Procedure: INTRAUTERINE DEVICE (IUD) INSERTION;  Surgeon: Steva Ready, DO;  Location: [facility];  Service: Gynecology;  Laterality: N/A;   KNEE ARTHROSCOPY Left    3 surgery on knee 09/12/2020   NASAL SEPTOPLASTY W/ TURBINOPLASTY     Medication List:  Current Outpatient Medications   Medication Sig Dispense Refill   albuterol (PROAIR HFA) 108 (90 Base) MCG/ACT inhaler      amLODipine (NORVASC) 5 MG tablet Take 5 mg by mouth daily.     buPROPion (WELLBUTRIN XL) 150 MG 24 hr tablet Take 150 mg by mouth every morning.     citalopram (CELEXA) 20 MG tablet Take 20 mg by mouth daily.     ferrous sulfate 324 MG TBEC Take 324 mg by mouth daily with breakfast.     folic acid (FOLVITE) 1 MG tablet Take 1 mg by mouth daily.     ibuprofen (ADVIL) 800 MG tablet Take 1 tablet (800 mg total) by mouth every 8 (eight) hours as needed for moderate pain, mild pain or cramping. 30 tablet 0   levothyroxine (SYNTHROID, LEVOTHROID) 100 MCG tablet Take 100 mcg by mouth daily before breakfast.     methotrexate (RHEUMATREX) 2.5 MG tablet 2.5 mg once a week. Caution:Chemotherapy. Protect from light Pt takes 1 ml weekly by injection.     Multiple Vitamin (MULTIVITAMIN WITH MINERALS) TABS tablet Take 1 tablet by mouth daily.     pantoprazole (PROTONIX) 40 MG tablet Take 40 mg by mouth 2 (two) times daily.     telmisartan (MICARDIS) 40 MG tablet Take 40 mg by mouth daily.     No current facility-administered medications for this visit.   Allergies: No Active Allergies  Social History:  Social History   Socioeconomic History   Marital status: Married    Spouse name: Not on file   Number of children: Not on file   Years of education: Not on file   Highest education level: Not on file  Occupational History   Not on file  Tobacco Use   Smoking status: Never   Smokeless tobacco: Never  Substance and Sexual Activity   Alcohol use: Not Currently    Comment: 1 amonth   Drug use: No   Sexual activity: Not on file  Other Topics Concern   Not on file  Social History Narrative   Not on file   Social Determinants of Health   Financial Resource Strain: Not on file  Food Insecurity: Not on file  Transportation Needs: Not on file  Physical Activity: Not on file  Stress: Not on file  Social  Connections: Not on file   Lives in a 53 year old house. Smoking: denies Occupation: Soil scientist HistorySurveyor, minerals in the house: no Carpet in the family room: no Carpet in the bedroom: no Heating: gas Cooling: central Pet: yes 2 dogs  Family History: History reviewed. No pertinent family history. Problem                               Relation Asthma                                   no Eczema                                no Food allergy                          no Allergic rhino conjunctivitis     no  Review of Systems  Constitutional:  Negative for appetite change, chills, fever and unexpected weight change.  HENT:  Negative for congestion and rhinorrhea.   Eyes:  Negative for itching.  Respiratory:  Negative for cough, chest tightness, shortness of breath and wheezing.   Cardiovascular:  Negative for chest pain.  Gastrointestinal:  Negative for abdominal pain.  Genitourinary:  Negative for difficulty urinating.  Skin:  Negative for rash.  Neurological:  Negative for headaches.    Objective: BP 126/82   Pulse 91   Temp 97.8 F (36.6 C)   Resp 16   Ht 5\' 4"  (1.626 m)   Wt 202 lb (91.6 kg)   SpO2 97%   BMI 34.67 kg/m  Body mass index is 34.67 kg/m. Physical Exam Vitals and nursing note reviewed.  Constitutional:      Appearance: Normal appearance. She is well-developed.  HENT:     Head: Normocephalic and atraumatic.     Right Ear: Tympanic membrane and external ear normal.     Left Ear: Tympanic membrane and external ear normal.     Nose: Nose normal.     Mouth/Throat:     Mouth: Mucous membranes are moist.     Pharynx: Oropharynx is clear.  Eyes:     Conjunctiva/sclera: Conjunctivae normal.  Cardiovascular:     Rate and Rhythm: Normal rate and regular rhythm.     Heart sounds: Normal heart sounds. No murmur heard.    No friction rub. No gallop.  Pulmonary:     Effort: Pulmonary effort is normal.     Breath sounds: Normal  breath sounds. No wheezing, rhonchi or rales.  Musculoskeletal:     Cervical back: Neck supple.  Skin:    General: Skin is warm.     Findings: No rash.  Neurological:     Mental Status: She is alert and oriented to person, place, and time.  Psychiatric:        Behavior: Behavior normal.   The plan was reviewed with the patient/family, and all questions/concerned were addressed.  It was my pleasure to see Chelsey Franco today and participate in her care. Please feel free to contact me with any questions or concerns.  Sincerely,  Wyline Mood, DO Allergy & Immunology  [facility] office: [phone] [facility] office: [phone]

## 2021-08-28 ENCOUNTER — Encounter: Payer: Self-pay | Admitting: Allergy

## 2021-08-28 ENCOUNTER — Ambulatory Visit (INDEPENDENT_AMBULATORY_CARE_PROVIDER_SITE_OTHER): Payer: BC Managed Care – PPO | Admitting: Allergy

## 2021-08-28 VITALS — BP 126/82 | HR 91 | Temp 97.8°F | Resp 16 | Ht 64.0 in | Wt 202.0 lb

## 2021-08-28 DIAGNOSIS — Z880 Allergy status to penicillin: Secondary | ICD-10-CM

## 2021-08-28 DIAGNOSIS — G2581 Restless legs syndrome: Secondary | ICD-10-CM | POA: Insufficient documentation

## 2021-08-28 DIAGNOSIS — E782 Mixed hyperlipidemia: Secondary | ICD-10-CM | POA: Insufficient documentation

## 2021-08-28 DIAGNOSIS — D509 Iron deficiency anemia, unspecified: Secondary | ICD-10-CM | POA: Insufficient documentation

## 2021-08-28 DIAGNOSIS — G44209 Tension-type headache, unspecified, not intractable: Secondary | ICD-10-CM | POA: Insufficient documentation

## 2021-08-28 DIAGNOSIS — N3010 Interstitial cystitis (chronic) without hematuria: Secondary | ICD-10-CM | POA: Insufficient documentation

## 2021-08-28 DIAGNOSIS — K219 Gastro-esophageal reflux disease without esophagitis: Secondary | ICD-10-CM | POA: Insufficient documentation

## 2021-08-28 DIAGNOSIS — I10 Essential (primary) hypertension: Secondary | ICD-10-CM | POA: Insufficient documentation

## 2021-08-28 DIAGNOSIS — J309 Allergic rhinitis, unspecified: Secondary | ICD-10-CM | POA: Insufficient documentation

## 2021-08-28 DIAGNOSIS — T63481A Toxic effect of venom of other arthropod, accidental (unintentional), initial encounter: Secondary | ICD-10-CM

## 2021-08-28 DIAGNOSIS — F439 Reaction to severe stress, unspecified: Secondary | ICD-10-CM | POA: Insufficient documentation

## 2021-08-28 DIAGNOSIS — E039 Hypothyroidism, unspecified: Secondary | ICD-10-CM | POA: Insufficient documentation

## 2021-08-28 DIAGNOSIS — E7800 Pure hypercholesterolemia, unspecified: Secondary | ICD-10-CM | POA: Insufficient documentation

## 2021-08-28 DIAGNOSIS — J329 Chronic sinusitis, unspecified: Secondary | ICD-10-CM | POA: Insufficient documentation

## 2021-08-28 DIAGNOSIS — J410 Simple chronic bronchitis: Secondary | ICD-10-CM | POA: Insufficient documentation

## 2021-08-28 DIAGNOSIS — E669 Obesity, unspecified: Secondary | ICD-10-CM | POA: Insufficient documentation

## 2021-08-28 DIAGNOSIS — Z91038 Other insect allergy status: Secondary | ICD-10-CM

## 2021-08-28 DIAGNOSIS — F329 Major depressive disorder, single episode, unspecified: Secondary | ICD-10-CM | POA: Insufficient documentation

## 2021-08-28 DIAGNOSIS — G4733 Obstructive sleep apnea (adult) (pediatric): Secondary | ICD-10-CM | POA: Insufficient documentation

## 2021-08-28 DIAGNOSIS — R400 Somnolence: Secondary | ICD-10-CM | POA: Insufficient documentation

## 2021-08-28 DIAGNOSIS — F325 Major depressive disorder, single episode, in full remission: Secondary | ICD-10-CM | POA: Insufficient documentation

## 2021-08-28 DIAGNOSIS — N9489 Other specified conditions associated with female genital organs and menstrual cycle: Secondary | ICD-10-CM | POA: Insufficient documentation

## 2021-08-28 DIAGNOSIS — F419 Anxiety disorder, unspecified: Secondary | ICD-10-CM | POA: Insufficient documentation

## 2021-08-28 DIAGNOSIS — F32A Depression, unspecified: Secondary | ICD-10-CM | POA: Insufficient documentation

## 2021-08-28 DIAGNOSIS — N951 Menopausal and female climacteric states: Secondary | ICD-10-CM | POA: Insufficient documentation

## 2021-08-28 NOTE — Assessment & Plan Note (Addendum)
Large localized reaction after 1 bee sting that eventually developed into cellulitis. Husband is keeping bees and patient helps out. No issues with prior stings. Denies any other symptoms.  . Large localized reactions are very common after hymenoptera stings.  . See handout. . Get bloodwork and if significantly positive will offer VIT.  o If negative then will continue to monitor symptoms.  o If symptoms worsen then will recommend skin testing next.  . No indication for Epipen given no systemic symptoms.  . For mild symptoms you can take over the counter antihistamines such as Benadryl and monitor symptoms closely. If symptoms worsen or if you have severe symptoms including breathing issues, throat closure, significant swelling, whole body hives, severe diarrhea and vomiting, lightheadedness then seek immediate medical care.

## 2021-08-28 NOTE — Assessment & Plan Note (Signed)
Hives as a young child after taking penicillin. She has apparently taken amoxicillin and other "cillin" medications in her adulthood with no reactions.   Discussed with patient that no indication for any testing for penicillin as penicillin and amoxicillin fall into the same family of medication.  Will remove penicillin from allergy list.  Your risk of developing penicillin allergy in the future is the same as the general population's.  May take penicillin type of antibiotics in the future if needed.

## 2021-08-28 NOTE — Patient Instructions (Signed)
Localized reaction to bee sting See handout. Get bloodwork We are ordering labs, so please allow 1-2 weeks for the results to come back. With the newly implemented Cures Act, the labs might be visible to you at the same time that they become visible to me. However, I will not address the results until all of the results are back, so please be patient.  In the meantime, continue recommendations in your patient instructions, including avoidance measures (if applicable), until you hear from me.  For mild symptoms you can take over the counter antihistamines such as Benadryl and monitor symptoms closely. If symptoms worsen or if you have severe symptoms including breathing issues, throat closure, significant swelling, whole body hives, severe diarrhea and vomiting, lightheadedness then seek immediate medical care.  Follow up if needed.  Our [place] office is moving in September 2023 to a new location. New address: [address] [place], [place], [place] [address] (white building). [place] office: [phone] (same phone number).

## 2021-09-01 LAB — ALLERGEN HYMENOPTERA PANEL
Bumblebee: <0.1 kU/L
Honeybee IgE: <0.1 kU/L
Hornet, White Face, IgE: <0.1 kU/L
Hornet, Yellow, IgE: <0.1 kU/L
Paper Wasp IgE: <0.1 kU/L
Yellow Jacket, IgE: <0.1 kU/L

## 2021-09-01 LAB — TRYPTASE: Tryptase: 12.5 ug/L (ref 2.2–13.2)

## 2021-09-07 DIAGNOSIS — L4050 Arthropathic psoriasis, unspecified: Secondary | ICD-10-CM | POA: Diagnosis not present

## 2021-11-02 DIAGNOSIS — L4050 Arthropathic psoriasis, unspecified: Secondary | ICD-10-CM | POA: Diagnosis not present

## 2021-11-13 DIAGNOSIS — Z79899 Other long term (current) drug therapy: Secondary | ICD-10-CM | POA: Diagnosis not present

## 2021-11-13 DIAGNOSIS — L409 Psoriasis, unspecified: Secondary | ICD-10-CM | POA: Diagnosis not present

## 2021-11-13 DIAGNOSIS — L4050 Arthropathic psoriasis, unspecified: Secondary | ICD-10-CM | POA: Diagnosis not present

## 2021-11-13 DIAGNOSIS — M79643 Pain in unspecified hand: Secondary | ICD-10-CM | POA: Diagnosis not present

## 2021-11-22 ENCOUNTER — Telehealth: Payer: Self-pay

## 2021-11-22 NOTE — Patient Outreach (Signed)
  Care Coordination   11/22/2021 Name: Chelsey Franco MRN: [id number] DOB: May 04, 1970   Care Coordination Outreach Attempts:  An unsuccessful telephone outreach was attempted today to offer the patient information about available care coordination services as a benefit of their health plan.   Follow Up Plan:  Additional outreach attempts will be made to offer the patient care coordination information and services.   Encounter Outcome:  No Answer  Care Coordination Interventions Activated:  No   Care Coordination Interventions:  No, not indicated    Peter Garter RN, BSN,CCM, [facility] [phone]

## 2021-12-04 ENCOUNTER — Telehealth: Payer: Self-pay

## 2021-12-04 NOTE — Patient Outreach (Signed)
  Care Coordination   Initial Visit Note   12/04/2021 Name: Chelsey Franco MRN: [id number] DOB: 1970/06/20  Chelsey Franco is a 51 y.o. year old female who sees Chelsey Funk, MD for primary care. I spoke with  Chelsey Franco by phone today.  What matters to the patients health and wellness today?  No concerns today    Goals Addressed             This Visit's Progress    COMPLETED: Care Coordination Activities - no follow up required       Care Coordination Interventions: Provided education to patient re: care coordination services Assessed social determinant of health barriers          SDOH assessments and interventions completed:  Yes  SDOH Interventions Today    Flowsheet Row Most Recent Value  SDOH Interventions   Food Insecurity Interventions Intervention Not Indicated  Housing Interventions Intervention Not Indicated  Transportation Interventions Intervention Not Indicated  Financial Strain Interventions Intervention Not Indicated        Care Coordination Interventions Activated:  Yes  Care Coordination Interventions:  Yes, provided   Follow up plan: No further intervention required.   Encounter Outcome:  Pt. Visit Completed  Dudley Major RN, BSN,CCM, CDE Care Management Coordinator [facility] [phone]

## 2021-12-04 NOTE — Patient Instructions (Signed)
Visit Information  Thank you for taking time to visit with me today. Please don't hesitate to contact me if I can be of assistance to you.   Following are the goals we discussed today:   Goals Addressed             This Visit's Progress    COMPLETED: Care Coordination Activities - no follow up required       Care Coordination Interventions: Provided education to patient re: care coordination services Assessed social determinant of health barriers        If you are experiencing a Mental Health or Behavioral Health Crisis or need someone to talk to, please call the Suicide and Crisis Lifeline: 988 call the [place] National Suicide Prevention Lifeline: 1-800-273-8255 or TTY: 1-800-799-4 TTY (1-800-799-4889) to talk to a trained counselor call 1-800-273-TALK (toll free, 24 hour hotline) go to [facility] [address], [place] ([phone]) call 911   The patient verbalized understanding of instructions, educational materials, and care plan provided today and DECLINED offer to receive copy of patient instructions, educational materials, and care plan.   No further follow up required:    Kashina Mecum RN, BSN,CCM, CDE Care Management Coordinator [facility] [phone]     

## 2021-12-09 DIAGNOSIS — R519 Headache, unspecified: Secondary | ICD-10-CM | POA: Diagnosis not present

## 2021-12-09 DIAGNOSIS — R059 Cough, unspecified: Secondary | ICD-10-CM | POA: Diagnosis not present

## 2021-12-09 DIAGNOSIS — Z03818 Encounter for observation for suspected exposure to other biological agents ruled out: Secondary | ICD-10-CM | POA: Diagnosis not present

## 2021-12-09 DIAGNOSIS — R0981 Nasal congestion: Secondary | ICD-10-CM | POA: Diagnosis not present

## 2021-12-27 DIAGNOSIS — L4050 Arthropathic psoriasis, unspecified: Secondary | ICD-10-CM | POA: Diagnosis not present

## 2021-12-27 DIAGNOSIS — R059 Cough, unspecified: Secondary | ICD-10-CM | POA: Diagnosis not present

## 2021-12-27 DIAGNOSIS — U071 COVID-19: Secondary | ICD-10-CM | POA: Diagnosis not present

## 2021-12-28 ENCOUNTER — Ambulatory Visit
Admission: RE | Admit: 2021-12-28 | Discharge: 2021-12-28 | Disposition: A | Discharge status: Home / Self Care | Payer: BC Managed Care – PPO | Source: Ambulatory Visit | Attending: Internal Medicine | Admitting: Internal Medicine

## 2021-12-28 ENCOUNTER — Other Ambulatory Visit: Payer: Self-pay | Admitting: Internal Medicine

## 2021-12-28 DIAGNOSIS — R059 Cough, unspecified: Secondary | ICD-10-CM

## 2022-01-10 DIAGNOSIS — L4050 Arthropathic psoriasis, unspecified: Secondary | ICD-10-CM | POA: Diagnosis not present

## 2022-02-12 DIAGNOSIS — L409 Psoriasis, unspecified: Secondary | ICD-10-CM | POA: Diagnosis not present

## 2022-02-12 DIAGNOSIS — L4050 Arthropathic psoriasis, unspecified: Secondary | ICD-10-CM | POA: Diagnosis not present

## 2022-02-12 DIAGNOSIS — M79643 Pain in unspecified hand: Secondary | ICD-10-CM | POA: Diagnosis not present

## 2022-02-12 DIAGNOSIS — Z79899 Other long term (current) drug therapy: Secondary | ICD-10-CM | POA: Diagnosis not present

## 2022-03-05 DIAGNOSIS — M25571 Pain in right ankle and joints of right foot: Secondary | ICD-10-CM | POA: Diagnosis not present

## 2022-03-05 DIAGNOSIS — M79671 Pain in right foot: Secondary | ICD-10-CM | POA: Diagnosis not present

## 2022-03-07 DIAGNOSIS — L4050 Arthropathic psoriasis, unspecified: Secondary | ICD-10-CM | POA: Diagnosis not present

## 2022-03-15 DIAGNOSIS — M76811 Anterior tibial syndrome, right leg: Secondary | ICD-10-CM | POA: Diagnosis not present

## 2022-03-15 DIAGNOSIS — M19071 Primary osteoarthritis, right ankle and foot: Secondary | ICD-10-CM | POA: Diagnosis not present

## 2022-04-11 DIAGNOSIS — M19071 Primary osteoarthritis, right ankle and foot: Secondary | ICD-10-CM | POA: Diagnosis not present

## 2022-04-16 ENCOUNTER — Other Ambulatory Visit: Payer: Self-pay | Admitting: Pain Medicine

## 2022-04-16 DIAGNOSIS — Z1231 Encounter for screening mammogram for malignant neoplasm of breast: Secondary | ICD-10-CM

## 2022-04-18 DIAGNOSIS — Z0000 Encounter for general adult medical examination without abnormal findings: Secondary | ICD-10-CM | POA: Diagnosis not present

## 2022-04-18 DIAGNOSIS — R3915 Urgency of urination: Secondary | ICD-10-CM | POA: Diagnosis not present

## 2022-04-18 DIAGNOSIS — E782 Mixed hyperlipidemia: Secondary | ICD-10-CM | POA: Diagnosis not present

## 2022-04-18 DIAGNOSIS — E039 Hypothyroidism, unspecified: Secondary | ICD-10-CM | POA: Diagnosis not present

## 2022-04-19 ENCOUNTER — Other Ambulatory Visit: Payer: Self-pay | Admitting: Obstetrics and Gynecology

## 2022-04-19 ENCOUNTER — Ambulatory Visit
Admission: RE | Admit: 2022-04-19 | Discharge: 2022-04-19 | Disposition: A | Discharge status: Home / Self Care | Payer: BC Managed Care – PPO | Source: Ambulatory Visit | Attending: Pain Medicine | Admitting: Pain Medicine

## 2022-04-19 DIAGNOSIS — Z1231 Encounter for screening mammogram for malignant neoplasm of breast: Secondary | ICD-10-CM | POA: Diagnosis not present

## 2022-04-19 DIAGNOSIS — M79671 Pain in right foot: Secondary | ICD-10-CM | POA: Diagnosis not present

## 2022-04-26 DIAGNOSIS — M19071 Primary osteoarthritis, right ankle and foot: Secondary | ICD-10-CM | POA: Diagnosis not present

## 2022-05-02 DIAGNOSIS — M19071 Primary osteoarthritis, right ankle and foot: Secondary | ICD-10-CM | POA: Diagnosis not present

## 2022-05-03 DIAGNOSIS — L4050 Arthropathic psoriasis, unspecified: Secondary | ICD-10-CM | POA: Diagnosis not present

## 2022-05-30 DIAGNOSIS — G4733 Obstructive sleep apnea (adult) (pediatric): Secondary | ICD-10-CM | POA: Diagnosis not present

## 2022-06-07 DIAGNOSIS — R350 Frequency of micturition: Secondary | ICD-10-CM | POA: Diagnosis not present

## 2022-06-07 DIAGNOSIS — Z01419 Encounter for gynecological examination (general) (routine) without abnormal findings: Secondary | ICD-10-CM | POA: Diagnosis not present

## 2022-06-07 DIAGNOSIS — N951 Menopausal and female climacteric states: Secondary | ICD-10-CM | POA: Diagnosis not present

## 2022-06-13 DIAGNOSIS — L4050 Arthropathic psoriasis, unspecified: Secondary | ICD-10-CM | POA: Diagnosis not present

## 2022-06-13 DIAGNOSIS — Z79899 Other long term (current) drug therapy: Secondary | ICD-10-CM | POA: Diagnosis not present

## 2022-06-13 DIAGNOSIS — H16223 Keratoconjunctivitis sicca, not specified as Sjogren's, bilateral: Secondary | ICD-10-CM | POA: Diagnosis not present

## 2022-06-13 DIAGNOSIS — L409 Psoriasis, unspecified: Secondary | ICD-10-CM | POA: Diagnosis not present

## 2022-06-13 DIAGNOSIS — M79643 Pain in unspecified hand: Secondary | ICD-10-CM | POA: Diagnosis not present

## 2022-06-28 DIAGNOSIS — L4050 Arthropathic psoriasis, unspecified: Secondary | ICD-10-CM | POA: Diagnosis not present

## 2022-07-03 DIAGNOSIS — I10 Essential (primary) hypertension: Secondary | ICD-10-CM | POA: Diagnosis not present

## 2022-07-03 DIAGNOSIS — G4733 Obstructive sleep apnea (adult) (pediatric): Secondary | ICD-10-CM | POA: Diagnosis not present

## 2022-07-18 DIAGNOSIS — G4733 Obstructive sleep apnea (adult) (pediatric): Secondary | ICD-10-CM | POA: Diagnosis not present

## 2022-07-20 DIAGNOSIS — D122 Benign neoplasm of ascending colon: Secondary | ICD-10-CM | POA: Diagnosis not present

## 2022-07-20 DIAGNOSIS — K5730 Diverticulosis of large intestine without perforation or abscess without bleeding: Secondary | ICD-10-CM | POA: Diagnosis not present

## 2022-07-20 DIAGNOSIS — K648 Other hemorrhoids: Secondary | ICD-10-CM | POA: Diagnosis not present

## 2022-07-20 DIAGNOSIS — K319 Disease of stomach and duodenum, unspecified: Secondary | ICD-10-CM | POA: Diagnosis not present

## 2022-07-20 DIAGNOSIS — K3189 Other diseases of stomach and duodenum: Secondary | ICD-10-CM | POA: Diagnosis not present

## 2022-07-20 DIAGNOSIS — K219 Gastro-esophageal reflux disease without esophagitis: Secondary | ICD-10-CM | POA: Diagnosis not present

## 2022-07-20 DIAGNOSIS — K229 Disease of esophagus, unspecified: Secondary | ICD-10-CM | POA: Diagnosis not present

## 2022-07-20 DIAGNOSIS — Z86010 Personal history of colonic polyps: Secondary | ICD-10-CM | POA: Diagnosis not present

## 2022-07-30 DIAGNOSIS — N3281 Overactive bladder: Secondary | ICD-10-CM | POA: Diagnosis not present

## 2022-07-30 DIAGNOSIS — Z1330 Encounter for screening examination for mental health and behavioral disorders, unspecified: Secondary | ICD-10-CM | POA: Diagnosis not present

## 2022-07-30 DIAGNOSIS — R8290 Unspecified abnormal findings in urine: Secondary | ICD-10-CM | POA: Diagnosis not present

## 2022-08-17 DIAGNOSIS — G4733 Obstructive sleep apnea (adult) (pediatric): Secondary | ICD-10-CM | POA: Diagnosis not present

## 2022-08-27 DIAGNOSIS — L4050 Arthropathic psoriasis, unspecified: Secondary | ICD-10-CM | POA: Diagnosis not present

## 2022-09-16 DIAGNOSIS — M722 Plantar fascial fibromatosis: Secondary | ICD-10-CM | POA: Diagnosis not present

## 2022-09-17 DIAGNOSIS — G4733 Obstructive sleep apnea (adult) (pediatric): Secondary | ICD-10-CM | POA: Diagnosis not present

## 2022-09-28 DIAGNOSIS — G4733 Obstructive sleep apnea (adult) (pediatric): Secondary | ICD-10-CM | POA: Diagnosis not present

## 2022-10-05 DIAGNOSIS — J069 Acute upper respiratory infection, unspecified: Secondary | ICD-10-CM | POA: Diagnosis not present

## 2022-10-05 DIAGNOSIS — R0789 Other chest pain: Secondary | ICD-10-CM | POA: Diagnosis not present

## 2022-10-10 DIAGNOSIS — G4733 Obstructive sleep apnea (adult) (pediatric): Secondary | ICD-10-CM | POA: Diagnosis not present

## 2022-10-10 DIAGNOSIS — I10 Essential (primary) hypertension: Secondary | ICD-10-CM | POA: Diagnosis not present

## 2022-10-15 DIAGNOSIS — L409 Psoriasis, unspecified: Secondary | ICD-10-CM | POA: Diagnosis not present

## 2022-10-15 DIAGNOSIS — M79643 Pain in unspecified hand: Secondary | ICD-10-CM | POA: Diagnosis not present

## 2022-10-15 DIAGNOSIS — Z79899 Other long term (current) drug therapy: Secondary | ICD-10-CM | POA: Diagnosis not present

## 2022-10-15 DIAGNOSIS — L4050 Arthropathic psoriasis, unspecified: Secondary | ICD-10-CM | POA: Diagnosis not present

## 2022-10-18 DIAGNOSIS — G4733 Obstructive sleep apnea (adult) (pediatric): Secondary | ICD-10-CM | POA: Diagnosis not present

## 2022-10-21 DIAGNOSIS — R0602 Shortness of breath: Secondary | ICD-10-CM | POA: Diagnosis not present

## 2022-10-21 DIAGNOSIS — R051 Acute cough: Secondary | ICD-10-CM | POA: Diagnosis not present

## 2022-10-30 DIAGNOSIS — G4733 Obstructive sleep apnea (adult) (pediatric): Secondary | ICD-10-CM | POA: Diagnosis not present

## 2022-11-01 DIAGNOSIS — J0100 Acute maxillary sinusitis, unspecified: Secondary | ICD-10-CM | POA: Diagnosis not present

## 2022-11-01 DIAGNOSIS — R911 Solitary pulmonary nodule: Secondary | ICD-10-CM | POA: Diagnosis not present

## 2022-11-01 DIAGNOSIS — J069 Acute upper respiratory infection, unspecified: Secondary | ICD-10-CM | POA: Diagnosis not present

## 2022-11-12 DIAGNOSIS — L4050 Arthropathic psoriasis, unspecified: Secondary | ICD-10-CM | POA: Diagnosis not present

## 2022-11-13 DIAGNOSIS — R0982 Postnasal drip: Secondary | ICD-10-CM | POA: Diagnosis not present

## 2022-11-13 DIAGNOSIS — B3781 Candidal esophagitis: Secondary | ICD-10-CM | POA: Diagnosis not present

## 2022-12-26 DIAGNOSIS — R0982 Postnasal drip: Secondary | ICD-10-CM | POA: Diagnosis not present

## 2023-01-07 DIAGNOSIS — L4050 Arthropathic psoriasis, unspecified: Secondary | ICD-10-CM | POA: Diagnosis not present

## 2023-02-27 ENCOUNTER — Encounter (HOSPITAL_BASED_OUTPATIENT_CLINIC_OR_DEPARTMENT_OTHER): Payer: Self-pay

## 2023-02-27 ENCOUNTER — Other Ambulatory Visit: Payer: Self-pay

## 2023-02-27 ENCOUNTER — Emergency Department (HOSPITAL_COMMUNITY): Payer: BC Managed Care – PPO

## 2023-02-27 ENCOUNTER — Observation Stay (HOSPITAL_COMMUNITY): Payer: BC Managed Care – PPO

## 2023-02-27 ENCOUNTER — Observation Stay (HOSPITAL_BASED_OUTPATIENT_CLINIC_OR_DEPARTMENT_OTHER)
Admission: EM | Admit: 2023-02-27 | Discharge: 2023-02-28 | Disposition: A | Discharge status: Home / Self Care | Payer: BC Managed Care – PPO | Attending: Internal Medicine | Admitting: Internal Medicine

## 2023-02-27 ENCOUNTER — Emergency Department (HOSPITAL_BASED_OUTPATIENT_CLINIC_OR_DEPARTMENT_OTHER): Payer: BC Managed Care – PPO

## 2023-02-27 DIAGNOSIS — G4733 Obstructive sleep apnea (adult) (pediatric): Secondary | ICD-10-CM | POA: Diagnosis not present

## 2023-02-27 DIAGNOSIS — R200 Anesthesia of skin: Principal | ICD-10-CM

## 2023-02-27 DIAGNOSIS — I6522 Occlusion and stenosis of left carotid artery: Secondary | ICD-10-CM | POA: Diagnosis not present

## 2023-02-27 DIAGNOSIS — R29810 Facial weakness: Secondary | ICD-10-CM | POA: Diagnosis not present

## 2023-02-27 DIAGNOSIS — R29818 Other symptoms and signs involving the nervous system: Secondary | ICD-10-CM | POA: Diagnosis not present

## 2023-02-27 DIAGNOSIS — E034 Atrophy of thyroid (acquired): Secondary | ICD-10-CM | POA: Insufficient documentation

## 2023-02-27 DIAGNOSIS — E039 Hypothyroidism, unspecified: Secondary | ICD-10-CM | POA: Diagnosis present

## 2023-02-27 DIAGNOSIS — Z7901 Long term (current) use of anticoagulants: Secondary | ICD-10-CM | POA: Insufficient documentation

## 2023-02-27 DIAGNOSIS — K219 Gastro-esophageal reflux disease without esophagitis: Secondary | ICD-10-CM | POA: Diagnosis not present

## 2023-02-27 DIAGNOSIS — Z79899 Other long term (current) drug therapy: Secondary | ICD-10-CM | POA: Insufficient documentation

## 2023-02-27 DIAGNOSIS — F32A Depression, unspecified: Secondary | ICD-10-CM | POA: Diagnosis not present

## 2023-02-27 DIAGNOSIS — G459 Transient cerebral ischemic attack, unspecified: Secondary | ICD-10-CM | POA: Diagnosis not present

## 2023-02-27 DIAGNOSIS — G43109 Migraine with aura, not intractable, without status migrainosus: Secondary | ICD-10-CM

## 2023-02-27 DIAGNOSIS — I10 Essential (primary) hypertension: Secondary | ICD-10-CM | POA: Diagnosis not present

## 2023-02-27 DIAGNOSIS — R202 Paresthesia of skin: Secondary | ICD-10-CM | POA: Diagnosis not present

## 2023-02-27 DIAGNOSIS — R000 Tachycardia, unspecified: Secondary | ICD-10-CM | POA: Diagnosis not present

## 2023-02-27 LAB — COMPREHENSIVE METABOLIC PANEL
ALT: 27 U/L (ref 0–44)
AST: 24 U/L (ref 15–41)
Albumin: 4.6 g/dL (ref 3.5–5.0)
Alkaline Phosphatase: 45 U/L (ref 38–126)
Anion gap: 9 (ref 5–15)
BUN: 13 mg/dL (ref 6–20)
CO2: 27 mmol/L (ref 22–32)
Calcium: 9.5 mg/dL (ref 8.9–10.3)
Chloride: 101 mmol/L (ref 98–111)
Creatinine, Ser: 0.91 mg/dL (ref 0.44–1.00)
GFR, Estimated: >60 mL/min (ref >60)
Glucose, Bld: 96 mg/dL (ref 70–99)
Potassium: 3.8 mmol/L (ref 3.5–5.1)
Sodium: 137 mmol/L (ref 135–145)
Total Bilirubin: 0.5 mg/dL (ref 0.0–1.2)
Total Protein: 8.5 g/dL — ABNORMAL HIGH (ref 6.5–8.1)

## 2023-02-27 LAB — CBC
HCT: 42.6 % (ref 36.0–46.0)
Hemoglobin: 14.4 g/dL (ref 12.0–15.0)
MCH: 32.4 pg (ref 26.0–34.0)
MCHC: 33.8 g/dL (ref 30.0–36.0)
MCV: 95.7 fL (ref 80.0–100.0)
Platelets: 438 10*3/uL — ABNORMAL HIGH (ref 150–400)
RBC: 4.45 MIL/uL (ref 3.87–5.11)
RDW: 12.9 % (ref 11.5–15.5)
WBC: 8.4 10*3/uL (ref 4.0–10.5)
nRBC: 0 % (ref 0.0–0.2)

## 2023-02-27 LAB — APTT: aPTT: 32 s (ref 24–36)

## 2023-02-27 LAB — DIFFERENTIAL
Abs Immature Granulocytes: 0.02 10*3/uL (ref 0.00–0.07)
Basophils Absolute: 0 10*3/uL (ref 0.0–0.1)
Basophils Relative: 0 %
Eosinophils Absolute: 0.1 10*3/uL (ref 0.0–0.5)
Eosinophils Relative: 1 %
Immature Granulocytes: 0 %
Lymphocytes Relative: 28 %
Lymphs Abs: 2.4 10*3/uL (ref 0.7–4.0)
Monocytes Absolute: 0.5 10*3/uL (ref 0.1–1.0)
Monocytes Relative: 6 %
Neutro Abs: 5.4 10*3/uL (ref 1.7–7.7)
Neutrophils Relative %: 65 %

## 2023-02-27 LAB — ETHANOL: Alcohol, Ethyl (B): <10 mg/dL (ref <10)

## 2023-02-27 LAB — PROTIME-INR
INR: 1 (ref 0.8–1.2)
Prothrombin Time: 13 s (ref 11.4–15.2)

## 2023-02-27 LAB — CBG MONITORING, ED: Glucose-Capillary: 98 mg/dL (ref 70–99)

## 2023-02-27 LAB — PREGNANCY, URINE: Preg Test, Ur: NEGATIVE

## 2023-02-27 MED ORDER — LORAZEPAM 2 MG/ML IJ SOLN
1.0000 mg | Freq: Once | INTRAMUSCULAR | Status: AC | PRN
  Administered 2023-02-27: 1 mg via INTRAVENOUS
  Filled 2023-02-27: qty 1

## 2023-02-27 MED ORDER — ACETAMINOPHEN 325 MG PO TABS: 650.0000 mg | ORAL_TABLET | Freq: Four times a day (QID) | ORAL | Status: DC | PRN

## 2023-02-27 MED ORDER — LABETALOL HCL 5 MG/ML IV SOLN: 10.0000 mg | INTRAVENOUS | Status: DC | PRN

## 2023-02-27 MED ORDER — GADOBUTROL 1 MMOL/ML IV SOLN
9.0000 mL | Freq: Once | INTRAVENOUS | Status: AC | PRN
  Administered 2023-02-27: 9 mL via INTRAVENOUS

## 2023-02-27 MED ORDER — SODIUM CHLORIDE 0.9% FLUSH
3.0000 mL | Freq: Once | INTRAVENOUS | Status: AC
  Administered 2023-02-27: 3 mL via INTRAVENOUS

## 2023-02-27 MED ORDER — STROKE: EARLY STAGES OF RECOVERY BOOK: Freq: Once | Status: DC

## 2023-02-27 MED ORDER — IOHEXOL 350 MG/ML SOLN
75.0000 mL | Freq: Once | INTRAVENOUS | Status: AC | PRN
  Administered 2023-02-27: 75 mL via INTRAVENOUS

## 2023-02-27 MED ORDER — MELATONIN 3 MG PO TABS: 3.0000 mg | ORAL_TABLET | Freq: Every evening | ORAL | Status: DC | PRN

## 2023-02-27 MED ORDER — ONDANSETRON HCL 4 MG/2ML IJ SOLN: 4.0000 mg | Freq: Four times a day (QID) | INTRAMUSCULAR | Status: DC | PRN

## 2023-02-27 MED ORDER — ASPIRIN 81 MG PO TBEC
81.0000 mg | DELAYED_RELEASE_TABLET | Freq: Every day | ORAL | Status: DC
  Administered 2023-02-28: 81 mg via ORAL
  Filled 2023-02-27: qty 1

## 2023-02-27 MED ORDER — ACETAMINOPHEN 650 MG RE SUPP: 650.0000 mg | Freq: Four times a day (QID) | RECTAL | Status: DC | PRN

## 2023-02-27 NOTE — ED Provider Notes (Signed)
 [facility]MENT AT [facility] Provider Note   CSN: [id number] Arrival date & time: 02/27/23  1041     History  Chief Complaint  Patient presents with   Numbness    Chelsey Franco is a 53 y.o. female.  Patient presents with left facial numbness and left arm numbness and abnormal sensation since approximately 7:30 AM.  Patient woke up feeling normal.  No history of stroke, blood clots, heart attack.  No blood thinner use.  Patient has high blood pressure history and psoriatic arthritis.  The history is provided by the patient.       Home Medications Prior to Admission medications   Medication Sig Start Date End Date Taking? Authorizing Provider  amLODipine (NORVASC) 5 MG tablet Take 5 mg by mouth daily.   Yes [provider]  buPROPion  (WELLBUTRIN  XL) 150 MG 24 hr tablet Take 150 mg by mouth every morning. 07/10/21  Yes [provider]  citalopram  (CELEXA ) 20 MG tablet Take 20 mg by mouth daily.   Yes [provider]  ferrous sulfate 324 MG TBEC Take 324 mg by mouth daily with breakfast.   Yes [provider]  folic acid (FOLVITE) 1 MG tablet Take 1 mg by mouth daily.   Yes [provider]  GEMTESA 75 MG TABS Take 1 tablet by mouth daily. 02/06/23  Yes [provider]  golimumab  (SIMPONI  ARIA) 50 MG/4ML SOLN injection Inject 2 mg into the vein every 8 (eight) weeks.  Infuse 2 mg/kg into a venous catheter every 8 (eight) weeks.   Yes [provider]  levothyroxine  (SYNTHROID , LEVOTHROID) 100 MCG tablet Take 100 mcg by mouth daily before breakfast.   Yes [provider]  methotrexate 50 MG/2ML injection Inject 25 mg into the muscle once a week. Inject 1 ml 25mg /ml   Yes [provider]  pantoprazole  (PROTONIX ) 40 MG tablet Take 40 mg by mouth 2 (two) times daily.   Yes [provider]  telmisartan (MICARDIS) 40 MG tablet Take 40 mg by mouth daily.   Yes [provider]  ibuprofen  (ADVIL ) 800 MG tablet Take 1 tablet (800 mg total) by mouth every 8 (eight) hours as needed for moderate pain, mild pain or cramping. Patient not taking: Reported on 02/27/2023 09/14/20   Davies, Melissa, DO      Allergies    Patient has no known allergies.    Review of Systems   Review of Systems  Constitutional:  Negative for chills and fever.  HENT:  Negative for congestion.   Eyes:  Negative for visual disturbance.  Respiratory:  Negative for shortness of breath.   Cardiovascular:  Negative for chest pain.  Gastrointestinal:  Negative for abdominal pain and vomiting.  Genitourinary:  Negative for dysuria and flank pain.  Musculoskeletal:  Negative for back pain, neck pain and neck stiffness.  Skin:  Negative for rash.  Neurological:  Positive for numbness. Negative for weakness, light-headedness and headaches.    Physical Exam Updated Vital Signs BP (!) 156/98 (BP Location: Left Leg)   Pulse (!) 114   Temp 98.4 F (36.9 C) (Oral)   Resp 16   Ht 5' 4 (1.626 m)   Wt 93 kg   SpO2 98%   BMI 35.19 kg/m  Physical Exam Vitals and nursing note reviewed.  Constitutional:      General: She is not in acute distress.    Appearance: She is well-developed.  HENT:     Head: Normocephalic and  atraumatic.     Mouth/Throat:     Mouth: Mucous membranes are moist.  Eyes:     General:        Right eye: No discharge.        Left eye: No discharge.     Conjunctiva/sclera: Conjunctivae normal.  Neck:     Trachea: No tracheal deviation.  Cardiovascular:     Rate and Rhythm: Normal rate and regular rhythm.     Heart sounds: No murmur heard. Pulmonary:     Effort: Pulmonary effort is normal.     Breath sounds: Normal breath sounds.  Abdominal:     General: There is no distension.     Palpations: Abdomen is soft.     Tenderness: There is no abdominal tenderness. There is no guarding.  Musculoskeletal:        General: No swelling or tenderness.      Cervical back: Normal range of motion and neck supple. No rigidity.  Skin:    General: Skin is warm.     Capillary Refill: Capillary refill takes less than 2 seconds.     Findings: No rash.  Neurological:     General: No focal deficit present.     Mental Status: She is alert.     Cranial Nerves: No cranial nerve deficit.     Comments: Subjective decrease sensation left arm versus right arm.  Overall no neurodeficits. 5+ strength in UE and LE with f/e at major joints. Sensation to palpation intact in UE and LE. CNs 2-12 grossly intact.  EOMFI.  PERRL.   Finger nose and coordination intact bilateral.   Visual fields intact to finger testing. No nystagmus   Psychiatric:        Mood and Affect: Mood normal.     ED Results / Procedures / Treatments   Labs (all labs ordered are listed, but only abnormal results are displayed) Labs Reviewed  CBC - Abnormal; Notable for the following components:      Result Value   Platelets 438 (*)    All other components within normal limits  PROTIME-INR  APTT  DIFFERENTIAL  PREGNANCY, URINE  COMPREHENSIVE METABOLIC PANEL  ETHANOL  CBG MONITORING, ED    EKG EKG Interpretation Date/Time:  Wednesday February 27 2023 10:56:15 EST Ventricular Rate:  116 PR Interval:  134 QRS Duration:  86 QT Interval:  330 QTC Calculation: 458 R Axis:   38  Text Interpretation: Sinus tachycardia Cannot rule out Anterior infarct , age undetermined Abnormal ECG When compared with ECG of 02-Sep-2016 10:19, Minimal criteria for Anterior infarct are now Present Confirmed by Tonia Chew [phone]) on 02/27/2023 12:30:18 PM  Radiology CT HEAD CODE STROKE WO CONTRAST` Result Date: 02/27/2023 CLINICAL DATA:  Code stroke.  Tightness in the left arm and leg EXAM: CT HEAD WITHOUT CONTRAST TECHNIQUE: Contiguous axial images were obtained from the base of the skull through the vertex without intravenous contrast. RADIATION DOSE REDUCTION: This exam was performed according  to the departmental dose-optimization program which includes automated exposure control, adjustment of the mA and/or kV according to patient size and/or use of iterative reconstruction technique. COMPARISON:  [facility] MR 09/02/16 FINDINGS: Brain: No evidence of acute infarction, hemorrhage, hydrocephalus, extra-axial collection or mass lesion/mass effect. Vascular: No hyperdense vessel or unexpected calcification. Skull: Normal. Negative for fracture or focal lesion. Sinuses/Orbits: No middle ear or mastoid effusion. Paranasal sinuses are clear. Orbits are unremarkable. Other: None. ASPECTS [facility] Stroke Program Early CT Score): 10 IMPRESSION: No hemorrhage or CT  evidence of an acute cortical infarct. Aspects is 10. Discussed with Dr. Tonia on 02/27/23 at 11:44 AM Electronically Signed   By: Lyndall Gore M.D.   On: 02/27/2023 11:44    Procedures .Critical Care  Performed by: Tonia Chew, MD Authorized by: Tonia Chew, MD   Critical care provider statement:    Critical care time (minutes):  30   Critical care start time:  02/27/2023 12:00 PM   Critical care end time:  02/27/2023 12:30 PM   Critical care time was exclusive of:  Separately billable procedures and treating other patients and teaching time   Critical care was necessary to treat or prevent imminent or life-threatening deterioration of the following conditions:  CNS failure or compromise   Critical care was time spent personally by me on the following activities:  Ordering and review of laboratory studies, ordering and review of radiographic studies, pulse oximetry, re-evaluation of patient's condition, discussions with consultants and ordering and performing treatments and interventions     Medications Ordered in ED Medications  sodium chloride  flush (NS) 0.9 % injection 3 mL (has no administration in time range)    ED Course/ Medical Decision Making/ A&P                                 Medical Decision Making Amount and/or  Complexity of Data Reviewed Labs: ordered. Radiology: ordered. ECG/medicine tests: ordered.   Patient presents with new neurologic signs and symptoms clinical concern for occult stroke versus TIA versus other.  With autoimmune history this also on the differential.  No fever or infectious symptoms.  No concern for Bell's palsy at this time.  Blood work independently reviewed reassuring normal white count normal hemoglobin electrolytes unremarkable normal glucose.    EKG sinus rhythm tachycardia.  Code stroke initiated as last known normal 7:30 AM.  Discussed with radiology CT scan of the head no acute abnormalities.  Discussed with neurology on the phone and recommended transfer urgently to [facility] for MRI and then admission/observation in the hospital for TIA.  Discussed with emergency doctor Dr Soyla Body who accepted at [facility].         Final Clinical Impression(s) / ED Diagnoses Final diagnoses:  Left facial numbness  Left arm numbness    Rx / DC Orders ED Discharge Orders     None         Tonia Chew, MD 02/27/23 1232

## 2023-02-27 NOTE — ED Triage Notes (Signed)
 States at 0730 developed numbness to left side face and going down left arm.  Numbness now having a tightness

## 2023-02-27 NOTE — ED Notes (Signed)
 Pt back from MRI

## 2023-02-27 NOTE — Consult Note (Signed)
 [facility] Telemedicine Consult   Requesting Provider: Fonda Rushing Consult Participants: Myself, bedside nurse, atrium nurse  Location of the provider: [facility] Location of the patient: [facility]  This consult was provided via telemedicine with 2-way video and audio communication. The patient/family was informed that care would be provided in this way and agreed to receive care in this manner.    Chief Complaint: Left face / arm numbness/tingling and arm tightness  HPI: Chelsey Franco is a 53 year old left-handed woman with a past medical history significant for hypertension, hyperlipidemia, obstructive sleep apnea adherent to CPAP, hypothyroidism, psoriasis on methotrexate and golimumab , TMJ  She endorses a significant amount of increased stress lately, and attributes her mild headache today to her typical stress/tension headache 3/10 in intensity frontal.  Denies any neck pain.  She reports she woke up fine at 7 AM today at around 7:30 AM she started to notice some left facial tingling which then involved her jaw and her arm.  Due to persistent symptoms she came to the ED for evaluation but by the time of my evaluation her symptoms were improving.  She denies any weakness at any point or any other neurological symptoms other than numbness/paresthesias and a sensation of tightness  LKW: 7:30 AM Thrombolytic given?: No, too mild to treat, very near the end of the safe treatment window Risks, benefits and alternatives were discussed IR Thrombectomy? No, exam not consistent with large vessel occlusion Modified Rankin Scale: 0-Completely asymptomatic and back to baseline post- stroke Time of teleneurologist evaluation: 11:18 AM  Exam: Vitals:   02/27/23 1049  BP: (!) 156/98  Pulse: (!) 114  Resp: 16  Temp: 98.4 F (36.9 C)  SpO2: 98%    General: No acute distress Pulmonary: breathing comfortably Cardiac: regular rate and rhythm on monitor   NIH Stroke scale 1A: Level  of Consciousness - 0 1B: Ask Month and Age - 0 1C: 'Blink Eyes' & 'Squeeze Hands' - 0 2: Test Horizontal Extraocular Movements - 0 3: Test Visual Fields - 0 4: Test Facial Palsy - 0 5A: Test Left Arm Motor Drift - 0 5B: Test Right Arm Motor Drift - 0 6A: Test Left Leg Motor Drift - 0 6B: Test Right Leg Motor Drift - 0 7: Test Limb Ataxia - 0 8: Test Sensation - 0 subjectively feels the same to touch on both sides but has some slightly abnormal sensation that is persistent on the left face, resolved in the arm 9: Test Language/Aphasia- 0 10: Test Dysarthria - 0 11: Test Extinction/Inattention - 0  She is able to write normally with her normal handwriting with her left hand.  She is able to ambulate normally from CT scanner to wheelchair  NIHSS score: 0   Imaging Reviewed:   Head CT personally reviewed, no acute intracranial process  Labs reviewed in epic and pertinent values follow:  Basic Metabolic Panel: No results for input(s): NA, K, CL, CO2, GLUCOSE, BUN, CREATININE, CALCIUM , MG, PHOS in the last 168 hours.  CBC: Recent Labs  Lab 02/27/23 1124  WBC 8.4  NEUTROABS 5.4  HGB 14.4  HCT 42.6  MCV 95.7  PLT 438*   Coagulation Studies: No results for input(s): LABPROT, INR in the last 72 hours.    Assessment: Multiple stroke risk factors but also autoimmune history.  Differential includes stuttering lacunar stroke/TIA or an autoimmune inflammatory process  CTA head and neck was not obtained as part of the stroke protocol as her examination was not consistent with LVO  Recommendations:  -MRI brain with and without contrast  -If evidence for inflammatory brain lesion, neurology will review scan and make further recommendations  -If positive for acute stroke, admit for stroke workup, if negative for acute stroke admit for TIA workup, and: Please utilize stroke/TIA order set  Order CTA head and neck for vessel imaging Load with Plavix  300 mg  followed by 75 mg daily  course to be determined based on vessel imaging Start aspirin  81 mg daily  Discussed with Dr. Zavitz via secure chat  Lola Jernigan MD-PhD [facility] [phone]   If 8pm-8am, please page neurology on call as listed in AMION.  CRITICAL CARE Performed by: Lola LITTIE Jernigan   Total critical care time: 50 minutes  Critical care time was exclusive of separately billable procedures and treating other patients.  Critical care was necessary to treat or prevent imminent or life-threatening deterioration.  Critical care was time spent personally by me on the following activities: development of treatment plan with patient and/or surrogate as well as nursing, discussions with consultants, evaluation of patient's response to treatment, examination of patient, obtaining history from patient or surrogate, ordering and performing treatments and interventions, ordering and review of laboratory studies, ordering and review of radiographic studies, pulse oximetry and re-evaluation of patient's condition.

## 2023-02-27 NOTE — H&P (Signed)
 History and Physical      Chelsey Franco FMW:[id number] DOB: [birth year] DOA: 02/27/2023; DOS: 02/27/2023  PCP: Cleotilde Jest  E, PA  Patient coming from: home   I have personally briefly reviewed patient's old medical records in [facility]  Chief Complaint: Left arm numbness  HPI: Chelsey Franco is a 53 y.o. female with medical history significant for essential hypertension, acquired hypothyroidism, depression, who is admitted to [facility] on 02/27/2023 by way of transfer from [facility] with suspected TIA after presenting from home to the latter facility complaining of left upper extremity numbness.  The patient conveys that she awoke in her normal state of health on the morning of 02/27/2023.  After being up for a little while, she reports development of sudden onset numbness/tingling in the left upper extremity as well as the left portion of her face.  The symptoms remained constant for slightly more than 2 hours before completely resolving in spontaneous fashion without any subsequent recurrence thereof.  Denies any associated acute focal weakness, dysphagia, dizziness, vertigo, nausea, vomiting, acute change in vision, diplopia, word finding difficulties, slurred speech, facial droop, or headache.  She also denies any associated chest pain, shortness of breath, palpitations, diaphoresis, presyncope, or syncope.  Denies any known history of stroke or TIA.  Her medical history is notable for essential hypertension.  She also notes a history of obstructive sleep apnea for which she has previously used an adjusting mouthguard.  Denies any known history of hyperlipidemia, diabetes, or paroxysmal atrial fibrillation.  She confirms that she is a lifelong non-smoker.  Not currently on any antilipid medication or any antiplatelet medications at home.     [facility] ED Course:  Vital signs in the ED were notable for the following: Afebrile; heart rates in the 90s to low 100s;  systolic blood pressures in the 1 teens to 140s; respiratory rate 16-18, oxygen saturation 95 to 100% on room air.  Labs were notable for the following: CMP notable for sodium 137, creatinine 0.91, glucose 96, and liver enzymes were within normal limits.  Serum ethanol level less than 10.  CBC notable for white blood cell count 8300, hemoglobin 14.4, platelet count 438.  INR 1.0.  PTT 32.  Per my interpretation, EKG in ED demonstrated the following: Sinus tachycardia with heart rate 116, normal intervals, no evidence of T wave or ST changes, including no evidence of ST elevation.  Imaging in the ED, per corresponding formal radiology read, was notable for the following: Noncontrast CT head, which showed no evidence of acute intracranial process, including no evidence of intracranial pressure or any evidence of acute infarct.  Ensuing MRI brain with without contrast performed at [facility] emergency department showed no evidence of acute intracranial process, including no evidence of acute infarct.  EDP at [facility] had d/w Dr. Jerrie via tele-neuro consult, who recommended [facility] admit for TIA work-up if MRI negative.   While in the ED, the following were administered: Ativan  1 mg IV x 1 leading up to the above imaging.  Subsequently, the patient was admitted for overnight observation for suspected TIA workup, including assessment of modifiable acute ischemic CVA risk factors.     Review of Systems: As per HPI otherwise 10 point review of systems negative.   Past Medical History:  Diagnosis Date   Anemia    has had for few yrs takes iron 09/12/2020   Arthritis    soriatic 09/12/2020   Depression    has had it for  20 yrs 09/12/2020   Hypertension    Hypothyroid    Sleep apnea    wears a mouth gua 08/22/2022rd    Past Surgical History:  Procedure Laterality Date   DILATATION & CURETTAGE/HYSTEROSCOPY WITH MYOSURE N/A 09/14/2020   Procedure: DILATATION & CURETTAGE/HYSTEROSCOPY WITH MYOSURE;   Surgeon: Storm Setter, DO;  Location: [facility];  Service: Gynecology;  Laterality: N/A;   INTRAUTERINE DEVICE (IUD) INSERTION N/A 09/14/2020   Procedure: INTRAUTERINE DEVICE (IUD) INSERTION;  Surgeon: Storm Setter, DO;  Location: [facility];  Service: Gynecology;  Laterality: N/A;   KNEE ARTHROSCOPY Left    3 surgery on knee 09/12/2020   NASAL SEPTOPLASTY W/ TURBINOPLASTY      Social History:  reports that she has never smoked. She has never used smokeless tobacco. She reports that she does not currently use alcohol. She reports that she does not use drugs.   No Known Allergies   Family history reviewed and not pertinent    Prior to Admission medications   Medication Sig Start Date End Date Taking? Authorizing Provider  amLODipine (NORVASC) 5 MG tablet Take 5 mg by mouth daily.   Yes [provider]  buPROPion  (WELLBUTRIN  XL) 150 MG 24 hr tablet Take 150 mg by mouth every morning. 07/10/21  Yes [provider]  citalopram  (CELEXA ) 20 MG tablet Take 20 mg by mouth daily.   Yes [provider]  ferrous sulfate 324 MG TBEC Take 324 mg by mouth daily with breakfast.   Yes [provider]  folic acid (FOLVITE) 1 MG tablet Take 1 mg by mouth daily.   Yes [provider]  GEMTESA 75 MG TABS Take 1 tablet by mouth daily. 02/06/23  Yes [provider]  golimumab  (SIMPONI  ARIA) 50 MG/4ML SOLN injection Inject 2 mg into the vein every 8 (eight) weeks.  Infuse 2 mg/kg into a venous catheter every 8 (eight) weeks.   Yes [provider]  levothyroxine  (SYNTHROID , LEVOTHROID) 100 MCG tablet Take 100 mcg by mouth daily before breakfast.   Yes [provider]  methotrexate 50 MG/2ML injection Inject 25 mg into the muscle once a week. Inject 1 ml 25mg /ml   Yes [provider]  pantoprazole  (PROTONIX ) 40 MG tablet Take 40 mg by mouth 2 (two) times daily.   Yes [provider]   telmisartan (MICARDIS) 40 MG tablet Take 40 mg by mouth daily.   Yes [provider]  ibuprofen  (ADVIL ) 800 MG tablet Take 1 tablet (800 mg total) by mouth every 8 (eight) hours as needed for moderate pain, mild pain or cramping. Patient not taking: Reported on 02/27/2023 09/14/20   Storm Setter, DO     Objective    Physical Exam: Vitals:   02/27/23 1405 02/27/23 1500 02/27/23 1910 02/27/23 2100  BP: (!) 145/104 (!) 142/94 119/76 134/77  Pulse: (!) 115 (!) 107 100 94  Resp: 16 16 18 11   Temp: 98.3 F (36.8 C)  98.1 F (36.7 C)   TempSrc: Oral  Oral   SpO2: 98% 96% 95% 98%  Weight:      Height:        General: appears to be stated age; alert, oriented Skin: warm, dry, no rash Head:  AT/[place] Mouth:  Oral mucosa membranes appear moist, normal dentition Neck: supple; trachea midline Heart:  RRR; did not appreciate any M/R/G Lungs: CTAB, did not appreciate any wheezes, rales, or rhonchi Abdomen: + BS; soft, ND, NT Vascular: 2+ pedal pulses  b/l; 2+ radial pulses b/l Extremities: no peripheral edema, no muscle wasting Neuro: strength and sensation intact in upper and lower extremities b/l; no pronator drift; no evidence suggestive of slurred speech, dysarthria, or facial droop; Normal muscle tone. No tremors.    Labs on Admission: I have personally reviewed following labs and imaging studies  CBC: Recent Labs  Lab 02/27/23 1124  WBC 8.4  NEUTROABS 5.4  HGB 14.4  HCT 42.6  MCV 95.7  PLT 438*   Basic Metabolic Panel: Recent Labs  Lab 02/27/23 1124  NA 137  K 3.8  CL 101  CO2 27  GLUCOSE 96  BUN 13  CREATININE 0.91  CALCIUM  9.5   GFR: Estimated Creatinine Clearance: 78.1 mL/min (by C-G formula based on SCr of 0.91 mg/dL). Liver Function Tests: Recent Labs  Lab 02/27/23 1124  AST 24  ALT 27  ALKPHOS 45  BILITOT 0.5  PROT 8.5*  ALBUMIN 4.6   No results for input(s): LIPASE, AMYLASE in the last 168 hours. No results for input(s): AMMONIA  in the last 168 hours. Coagulation Profile: Recent Labs  Lab 02/27/23 1124  INR 1.0   Cardiac Enzymes: No results for input(s): CKTOTAL, CKMB, CKMBINDEX, TROPONINI in the last 168 hours. BNP (last 3 results) No results for input(s): PROBNP in the last 8760 hours. HbA1C: No results for input(s): HGBA1C in the last 72 hours. CBG: Recent Labs  Lab 02/27/23 1151  GLUCAP 98   Lipid Profile: No results for input(s): CHOL, HDL, LDLCALC, TRIG, CHOLHDL, LDLDIRECT in the last 72 hours. Thyroid  Function Tests: No results for input(s): TSH, T4TOTAL, FREET4, T3FREE, THYROIDAB in the last 72 hours. Anemia Panel: No results for input(s): VITAMINB12, FOLATE, FERRITIN, TIBC, IRON, RETICCTPCT in the last 72 hours. Urine analysis:    Component Value Date/Time   COLORURINE YELLOW 09/02/2016 1154   APPEARANCEUR CLEAR 09/02/2016 1154   LABSPEC 1.019 09/02/2016 1154   PHURINE 6.0 09/02/2016 1154   GLUCOSEU NEGATIVE 09/02/2016 1154   HGBUR MODERATE (A) 09/02/2016 1154   BILIRUBINUR NEGATIVE 09/02/2016 1154   KETONESUR NEGATIVE 09/02/2016 1154   PROTEINUR NEGATIVE 09/02/2016 1154   UROBILINOGEN 0.2 07/08/2013 2340   NITRITE NEGATIVE 09/02/2016 1154   LEUKOCYTESUR NEGATIVE 09/02/2016 1154    Radiological Exams on Admission: MR BRAIN W WO CONTRAST Result Date: 02/27/2023 CLINICAL DATA:  Right facial numbness. Left arm numbness. Abnormal sensation beginning at 7:30 a.m. EXAM: MRI HEAD WITHOUT AND WITH CONTRAST TECHNIQUE: Multiplanar, multiecho pulse sequences of the brain and surrounding structures were obtained without and with intravenous contrast. CONTRAST:  9mL GADAVIST  GADOBUTROL  1 MMOL/ML IV SOLN COMPARISON:  CT head without contrast 02/27/2023 at 11:24 a.m. MR head without contrast 09/02/2016 FINDINGS: Brain: No acute infarct, hemorrhage, or mass lesion is present. Deep brain nuclei are within normal limits. No significant white matter lesions are  present. The ventricles are of normal size. No significant extraaxial fluid collection is present. The brainstem and cerebellum are within normal limits. The internal auditory canals are within normal limits. Midline structures are within normal limits. Postcontrast images demonstrate no pathologic enhancement. Vascular: Flow is present in the major intracranial arteries. Skull and upper cervical spine: The craniocervical junction is normal. Upper cervical spine is within normal limits. Marrow signal is unremarkable. Sinuses/Orbits: A right mastoid effusion is present. No obstructing nasopharyngeal lesion is present. The paranasal sinuses and mastoid air cells are otherwise clear. The globes and orbits are within normal limits. IMPRESSION: 1. Normal MRI appearance of the brain. No acute or  focal lesion to explain the patient's symptoms. 2. Right mastoid effusion. No obstructing nasopharyngeal lesion is present. Electronically Signed   By: Lonni Necessary M.D.   On: 02/27/2023 19:13   CT HEAD CODE STROKE WO CONTRAST` Result Date: 02/27/2023 CLINICAL DATA:  Code stroke.  Tightness in the left arm and leg EXAM: CT HEAD WITHOUT CONTRAST TECHNIQUE: Contiguous axial images were obtained from the base of the skull through the vertex without intravenous contrast. RADIATION DOSE REDUCTION: This exam was performed according to the departmental dose-optimization program which includes automated exposure control, adjustment of the mA and/or kV according to patient size and/or use of iterative reconstruction technique. COMPARISON:  [facility] MR 09/02/16 FINDINGS: Brain: No evidence of acute infarction, hemorrhage, hydrocephalus, extra-axial collection or mass lesion/mass effect. Vascular: No hyperdense vessel or unexpected calcification. Skull: Normal. Negative for fracture or focal lesion. Sinuses/Orbits: No middle ear or mastoid effusion. Paranasal sinuses are clear. Orbits are unremarkable. Other: None. ASPECTS [facility]  Stroke Program Early CT Score): 10 IMPRESSION: No hemorrhage or CT evidence of an acute cortical infarct. Aspects is 10. Discussed with Dr. Tonia on 02/27/23 at 11:44 AM Electronically Signed   By: Lyndall Gore M.D.   On: 02/27/2023 11:44      Assessment/Plan   Principal Problem:   TIA (transient ischemic attack) Active Problems:   Essential hypertension   GERD (gastroesophageal reflux disease)   Acquired hypothyroidism   Depression    #) TIA: suspected dx on the basis of acute onset of left-sided numbness/tingling involving the left upper extremity and left portion of the face starting at 730 on 02/27/2023, with ensuing complete spontaneous resolution of the symptoms by 10 AM on 02/27/2023, without any recurrence or additional deficits noted, while CT head showed no evidence of acute intracranial process, and MRI brain also showed no evidence of acute intracranial process, including evidence of acute infarct.   EDP discussed patient's case and imaging with the on-call  tele stroke neurologist, Dr.  Jerrie, who recommended admission to the hospitalist service for further evaluation/management of concern for TIA, including further assessment of potential modifiable ischemic CVA risk factors.  Stroke team to follow.     The patient possesses multiple modifiable CVA risk factors including a history of hypertension, obstructive sleep apnea. No known history of hyperlipidemia, diabetes, paroxysmal atrial fibrillation.  Additionally, she is a lifelong non-smoker EKG in ED today showed sinus tachycardia without overt evidence of acute ischemic changes. Will perform further assessment of modifiable ischemic CVA risk factors, detailed below.   Not a candidate for TPA administration given that the patient's symptoms spontaneously resolved. Current outpatient antiplatelet/anticoagulant regimen:   Will follow for neuro recommendations regarding antiplatelet therapy. Current outpatient anti-lipid regimen:  None.   Will allow for permissive hypertension for 24-48 hours following onset of acute focal neurologic deficits, with associated parameters further quantified below, and with period of observance of permissive hypertension to end at  0730 on 03/01/23.   Plan: Nursing bedside swallow evaluation x 1 now, and will not initiate oral medications or diet until the patient has passed this. Head of the bed at 30 degrees. Neuro checks per protocol. VS per protocol. Will allow for permissive hypertension for 24-48 hours during which will hold home antihypertensive medications, with prn IV labetalol  ordered for SBP greater than 220 mmHg or DBP greater than 110 mmHg until  0730 AM on 2/7. Monitor on telemetry, including monitoring for atrial fibrillation as modifiable risk factor for acute ischemic CVA.   CTA head  and neck.  TTE with bubble study has been ordered for the morning. Additionally, as component of evaluation of potential modifiable ischemic CVA risk factors, will also check lipid panel and A1c. PT/OT/ST consults have been ordered for the morning.  Daily baby aspirin  ordered.  Stroke service to follow.  Urinary drug screen.                     #) Essential Hypertension: documented h/o such, with outpatient antihypertensive regimen including amlodipine, telmisartan.  SBP's in the ED today: In the 1 teens to 140s mmHg.  However, given suspected presenting TIA, will observe permissive hypertension for 24 to 48 hours from the onset of the patient's suspected acute focal neurologic deficits, with this period of permissive hypertension to end around 7:30 AM on 03/01/2023.  Plan: Close monitoring of subsequent BP via routine VS. hold home antihypertensive medications for now.  Prn IV labetalol , with parameters as above.  Monitor on telemetry.                     #) acquired hypothyroidism: documented h/o such, on Synthroid  as outpatient.   Plan: cont home Synthroid .                     #) Depression: documented h/o such. On Wellbutrin  as well as Celexa  as outpatient.  Of note, presenting EKG shows no evidence of QTc prolongation.  Plan: Continue outpatient Wellbutrin  and Celexa .                  #) GERD: documented h/o such; on Protonix  as outpatient.   Plan: continue home PPI.         DVT prophylaxis: SCD's   Code Status: Full code Family Communication: none Disposition Plan: Per Rounding Team Consults called: EDP at [facility] had d/w Dr. Jerrie via tele-neuro consult, who recommended [facility] admit for TIA work-up if MRI negative. ;  Admission status: Observation     I SPENT GREATER THAN 75  MINUTES IN CLINICAL CARE TIME/MEDICAL DECISION-MAKING IN COMPLETING THIS ADMISSION.      Jalasia Eskridge B Timber Lucarelli DO [facility]  From 7PM - 7AM   02/27/2023, 9:15 PM

## 2023-02-27 NOTE — ED Notes (Signed)
 Pt transported to MRI

## 2023-02-27 NOTE — ED Notes (Signed)
 Called Thomas at [facility] for transport 12:19

## 2023-02-27 NOTE — ED Triage Notes (Signed)
 PT BIB [facility] from [facility] for MRI r/t left sided facial and arm numbness.  Symptoms have resolved.  NIH 0 per [facility].    158/86 HR 111, 99% RA GCS 15.

## 2023-02-27 NOTE — ED Provider Notes (Signed)
 Care of patient received from prior provider at 3:09 PM, please see their note for complete H/P and care plan.  Received handoff per ED course.  Clinical Course as of 02/27/23 1510  Wed Feb 27, 2023  1509 Transferred from [facility] ED for neurologic symptoms. MRI pending. Admission to medicine after planned. [CC]    Clinical Course User Index [CC] Jerral Meth, MD    Reassessment: Negative MRI. Likely TIA as symptoms resolved.  Disposition:   Based on the above findings, I believe this patient is stable for admission.    Patient/family educated about specific findings on our evaluation and explained exact reasons for admission.  Patient/family educated about clinical situation and time was allowed to answer questions.   Admission team communicated with and agreed with need for admission. Patient admitted. Patient ready to move at this time.     Emergency Department Medication Summary:   Medications  acetaminophen  (TYLENOL ) tablet 650 mg (has no administration in time range)    Or  acetaminophen  (TYLENOL ) suppository 650 mg (has no administration in time range)  melatonin tablet 3 mg (has no administration in time range)  ondansetron  (ZOFRAN ) injection 4 mg (has no administration in time range)   stroke: early stages of recovery book ( Does not apply Not Given 02/27/23 2121)  aspirin  EC tablet 81 mg (has no administration in time range)  labetalol  (NORMODYNE ) injection 10 mg (has no administration in time range)  sodium chloride  flush (NS) 0.9 % injection 3 mL (3 mLs Intravenous Given 02/27/23 1200)  LORazepam  (ATIVAN ) injection 1 mg (1 mg Intravenous Given 02/27/23 1802)  gadobutrol  (GADAVIST ) 1 MMOL/ML injection 9 mL (9 mLs Intravenous Contrast Given 02/27/23 1854)  iohexol  (OMNIPAQUE ) 350 MG/ML injection 75 mL (75 mLs Intravenous Contrast Given 02/27/23 2155)           Jerral Meth, MD 02/27/23 2221

## 2023-02-27 NOTE — Progress Notes (Signed)
 Telestroke cart was activated at 1114. Per treatment team, pt's LKW was at 0730 with c/o L arm/face tingling sensation. Dr. Tonia, EDP, assessed pt prior to cart activation. Pt transported to CT at 1114 and returned to room at 1131. [facility] was paged for code stroke at 1115. Dr. Jerrie, [facility] appeared on telestroke cart at 1118 to assess the patient. Based on [facility]'s assessment, pt does not meet criteria for emergent interventions at this time 2/2 mild symptoms. [facility] to f/u with EDP regarding recommendations. No further needs from Telestroke RN at this time. Telestroke cart disconnected at 1142.    mRS- 0

## 2023-02-28 ENCOUNTER — Observation Stay (HOSPITAL_COMMUNITY): Payer: BC Managed Care – PPO

## 2023-02-28 ENCOUNTER — Encounter (HOSPITAL_COMMUNITY): Payer: Self-pay | Admitting: Internal Medicine

## 2023-02-28 DIAGNOSIS — F32A Depression, unspecified: Secondary | ICD-10-CM | POA: Diagnosis not present

## 2023-02-28 DIAGNOSIS — G459 Transient cerebral ischemic attack, unspecified: Secondary | ICD-10-CM | POA: Diagnosis not present

## 2023-02-28 DIAGNOSIS — E039 Hypothyroidism, unspecified: Secondary | ICD-10-CM | POA: Diagnosis not present

## 2023-02-28 DIAGNOSIS — R200 Anesthesia of skin: Principal | ICD-10-CM

## 2023-02-28 DIAGNOSIS — G43109 Migraine with aura, not intractable, without status migrainosus: Secondary | ICD-10-CM | POA: Diagnosis not present

## 2023-02-28 LAB — COMPREHENSIVE METABOLIC PANEL
ALT: 28 U/L (ref 0–44)
AST: 23 U/L (ref 15–41)
Albumin: 3.5 g/dL (ref 3.5–5.0)
Alkaline Phosphatase: 38 U/L (ref 38–126)
Anion gap: 9 (ref 5–15)
BUN: 13 mg/dL (ref 6–20)
CO2: 26 mmol/L (ref 22–32)
Calcium: 9.2 mg/dL (ref 8.9–10.3)
Chloride: 105 mmol/L (ref 98–111)
Creatinine, Ser: 1.01 mg/dL — ABNORMAL HIGH (ref 0.44–1.00)
GFR, Estimated: >60 mL/min (ref >60)
Glucose, Bld: 99 mg/dL (ref 70–99)
Potassium: 4.1 mmol/L (ref 3.5–5.1)
Sodium: 140 mmol/L (ref 135–145)
Total Bilirubin: 0.9 mg/dL (ref 0.0–1.2)
Total Protein: 7.1 g/dL (ref 6.5–8.1)

## 2023-02-28 LAB — CBC WITH DIFFERENTIAL/PLATELET
Abs Immature Granulocytes: 0.02 10*3/uL (ref 0.00–0.07)
Basophils Absolute: 0.1 10*3/uL (ref 0.0–0.1)
Basophils Relative: 1 %
Eosinophils Absolute: 0.2 10*3/uL (ref 0.0–0.5)
Eosinophils Relative: 2 %
HCT: 39.6 % (ref 36.0–46.0)
Hemoglobin: 13.2 g/dL (ref 12.0–15.0)
Immature Granulocytes: 0 %
Lymphocytes Relative: 35 %
Lymphs Abs: 2.8 10*3/uL (ref 0.7–4.0)
MCH: 32.1 pg (ref 26.0–34.0)
MCHC: 33.3 g/dL (ref 30.0–36.0)
MCV: 96.4 fL (ref 80.0–100.0)
Monocytes Absolute: 0.9 10*3/uL (ref 0.1–1.0)
Monocytes Relative: 11 %
Neutro Abs: 4.3 10*3/uL (ref 1.7–7.7)
Neutrophils Relative %: 51 %
Platelets: 384 10*3/uL (ref 150–400)
RBC: 4.11 MIL/uL (ref 3.87–5.11)
RDW: 12.9 % (ref 11.5–15.5)
WBC: 8.2 10*3/uL (ref 4.0–10.5)
nRBC: 0 % (ref 0.0–0.2)

## 2023-02-28 LAB — MAGNESIUM
Magnesium: 2.1 mg/dL (ref 1.7–2.4)
Magnesium: 2.2 mg/dL (ref 1.7–2.4)

## 2023-02-28 LAB — LIPID PANEL
Cholesterol: 287 mg/dL — ABNORMAL HIGH (ref 0–200)
HDL: 62 mg/dL
LDL Cholesterol: 201 mg/dL — ABNORMAL HIGH (ref 0–99)
Total CHOL/HDL Ratio: 4.6 ratio
Triglycerides: 119 mg/dL
VLDL: 24 mg/dL (ref 0–40)

## 2023-02-28 LAB — ECHOCARDIOGRAM COMPLETE BUBBLE STUDY
AR max vel: 1.96 cm2
AV Area VTI: 2 cm2
AV Area mean vel: 1.98 cm2
AV Mean grad: 4 mm[Hg]
AV Peak grad: 6.9 mm[Hg]
Ao pk vel: 1.31 m/s
Area-P 1/2: 7.37 cm2
MV M vel: 4.31 m/s
MV Peak grad: 74.3 mm[Hg]
MV VTI: 2.28 cm2
S' Lateral: 2.7 cm

## 2023-02-28 LAB — HEMOGLOBIN A1C
Hgb A1c MFr Bld: 5.5 % (ref 4.8–5.6)
Mean Plasma Glucose: 111.15 mg/dL

## 2023-02-28 LAB — ANTITHROMBIN III: AntiThromb III Func: 112 % (ref 75–120)

## 2023-02-28 MED ORDER — ATORVASTATIN CALCIUM 40 MG PO TABS: 40.0000 mg | ORAL_TABLET | Freq: Every day | ORAL | Status: DC

## 2023-02-28 MED ORDER — CLOPIDOGREL BISULFATE 75 MG PO TABS
75.0000 mg | ORAL_TABLET | Freq: Every day | ORAL | Status: DC
  Administered 2023-02-28: 75 mg via ORAL
  Filled 2023-02-28: qty 1

## 2023-02-28 MED ORDER — LEVOTHYROXINE SODIUM 100 MCG PO TABS
100.0000 ug | ORAL_TABLET | Freq: Every day | ORAL | Status: DC
  Administered 2023-02-28: 100 ug via ORAL
  Filled 2023-02-28: qty 1

## 2023-02-28 MED ORDER — ACETAMINOPHEN 325 MG PO TABS: 650.0000 mg | ORAL_TABLET | Freq: Four times a day (QID) | ORAL | 0 refills | Status: AC | PRN

## 2023-02-28 MED ORDER — CITALOPRAM HYDROBROMIDE 10 MG PO TABS
20.0000 mg | ORAL_TABLET | Freq: Every day | ORAL | Status: DC
Start: 2023-02-28 — End: 2023-02-28
  Administered 2023-02-28: 20 mg via ORAL
  Filled 2023-02-28: qty 2

## 2023-02-28 MED ORDER — ASPIRIN 81 MG PO TBEC: 81.0000 mg | DELAYED_RELEASE_TABLET | Freq: Every day | ORAL | 12 refills | Status: AC

## 2023-02-28 MED ORDER — ORAL CARE MOUTH RINSE: 15.0000 mL | OROMUCOSAL | Status: DC | PRN

## 2023-02-28 MED ORDER — ATORVASTATIN CALCIUM 80 MG PO TABS: 80.0000 mg | ORAL_TABLET | Freq: Every day | ORAL | 0 refills | Status: AC

## 2023-02-28 MED ORDER — BUPROPION HCL ER (XL) 150 MG PO TB24
150.0000 mg | ORAL_TABLET | Freq: Every morning | ORAL | Status: DC
  Administered 2023-02-28: 150 mg via ORAL
  Filled 2023-02-28: qty 1

## 2023-02-28 MED ORDER — PANTOPRAZOLE SODIUM 40 MG PO TBEC
40.0000 mg | DELAYED_RELEASE_TABLET | Freq: Two times a day (BID) | ORAL | Status: DC
  Administered 2023-02-28: 40 mg via ORAL
  Filled 2023-02-28: qty 1

## 2023-02-28 MED ORDER — CLOPIDOGREL BISULFATE 75 MG PO TABS: 75.0000 mg | ORAL_TABLET | Freq: Every day | ORAL | 0 refills | Status: AC

## 2023-02-28 MED ORDER — ATORVASTATIN CALCIUM 80 MG PO TABS: 80.0000 mg | ORAL_TABLET | Freq: Every day | ORAL | Status: DC

## 2023-02-28 NOTE — Discharge Summary (Signed)
 Physician Discharge Summary   Patient: Chelsey Franco MRN: [id number] DOB: [birth year]  Admit date:     02/27/2023  Discharge date: 02/28/23  Discharge Physician: Chelsey Marker, DO   PCP: Chelsey Franco  E, PA   Recommendations at discharge:   With PCP within 1 to 2 weeks and repeat CBC, CMP, mag, Phos within 1 week Follow-up with neurology in outpatient setting in 4 weeks and continue dual antiplatelet therapy for 3 weeks with aspirin  and Plavix  and then just aspirin  alone; patient will also need to be continued on her high-dose statin.  Discharge Diagnoses: Principal Problem:   TIA (transient ischemic attack) Active Problems:   Essential hypertension   GERD (gastroesophageal reflux disease)   Acquired hypothyroidism   Depression   Left facial numbness   Left arm numbness  Resolved Problems:   * No resolved hospital problems. [facility] Course: The patient is a 53 year old Caucasian female with a past medical history significant for Mannam to essential hypertension, acquired hypothyroidism, depression as well as other comorbidities who presented to the hospital with left arm numbness after she developed sudden left arm numbness and tingling and left lower extremity over the left portion of her face.  The symptoms remained constant for more than 2 hours then completely resolved in fashion.  She denied any associated focal weakness dysphagia, nausea or vomiting.  Denied any history of TIA or stroke.  She was admitted for further stroke evaluation and neurology feels that she may have had a complicated migraine but given that she has risk factors for TIA they placed her on dual antiplatelet therapy for 3 weeks and then just aspirin  alone as well as high-dose statin.  At this time her workup is medically and she is medically stable for discharge and need to follow-up with PCP and neurology in outpatient setting.  Assessment and Plan:  Likely complicated migraine -Cannot effectively rule  out TIA given her risk factors -Head CT stroke showed no acute abnormality and CTA of the head neck showed no LVO or hemodynamically significant stenosis but did show a mild left carotid bifurcation atherosclerosis -MRI done and showed no acute abnormality -TTE with bubble study is showing an EF of 60 to 65% -Hypercoagulable panel is still pending only be followed in the outpatient setting -LDL was elevated at 201 and she was initiated on high-dose statin with atorvastatin  80 minutes p.o. daily -Hemoglobin A1c was 5.5 -PT OT recommended no follow-up -Her symptoms completely resolved and has been placed on dual antiplatelet therapy for 3 weeks with aspirin  Plavix  and then just aspirin  alone  Essential hypertension -Continue with amlodipine 5 mg p.o. daily and telmisartan 40 g p.o. daily and maintain normotension at home  Hypothyroidism which is acquired -Continue with levothyroxine    Depression and anxiety -Continue Wellbutrin  and Celexa    GERD/GI prophylaxis -Continue with PPI with pantoprazole   HLD -Patient is a total cholesterol/HDL ratio 4.6, cholesterol level 287, LDL 51, HDL 62, triglycerides 119 and VLDL 24 -Initiated on atorvastatin  80 mg p.o. daily  Class II Obesity -Complicates overall prognosis and care -Estimated body mass index is 35.19 kg/m as calculated from the following:   Height as of this encounter: 5' 4 (1.626 m).   Weight as of this encounter: 93 kg.  -Weight Loss and Dietary Counseling given  Consultants: Neurology Procedures performed: As delineated as above Disposition: Home Diet recommendation:  Discharge Diet Orders (From admission, onward)     Start     Ordered   02/28/23 0000  Diet - low sodium heart healthy        02/28/23 1612           Cardiac diet DISCHARGE MEDICATION: Allergies as of 02/28/2023   No Known Allergies      Medication List     STOP taking these medications    ibuprofen  800 MG tablet Commonly known as: ADVIL         TAKE these medications    acetaminophen  325 MG tablet Commonly known as: TYLENOL  Take 2 tablets (650 mg total) by mouth every 6 (six) hours as needed for mild pain (pain score 1-3) (or Fever >/= 101).   amLODipine 5 MG tablet Commonly known as: NORVASC Take 5 mg by mouth daily.   aspirin  EC 81 MG tablet Take 1 tablet (81 mg total) by mouth daily. Swallow whole.   atorvastatin  80 MG tablet Commonly known as: LIPITOR Take 1 tablet (80 mg total) by mouth daily.   buPROPion  150 MG 24 hr tablet Commonly known as: WELLBUTRIN  XL Take 150 mg by mouth every morning.   citalopram  20 MG tablet Commonly known as: CELEXA  Take 20 mg by mouth daily.   clopidogrel  75 MG tablet Commonly known as: PLAVIX  Take 1 tablet (75 mg total) by mouth daily for 21 days.   ferrous sulfate 324 MG Tbec Take 324 mg by mouth daily with breakfast.   folic acid 1 MG tablet Commonly known as: FOLVITE Take 1 mg by mouth daily.   Gemtesa 75 MG Tabs Generic drug: Vibegron Take 1 tablet by mouth daily.   levothyroxine  100 MCG tablet Commonly known as: SYNTHROID  Take 100 mcg by mouth daily before breakfast.   methotrexate 50 MG/2ML injection Inject 25 mg into the muscle once a week. Inject 1 ml 25mg /ml   pantoprazole  40 MG tablet Commonly known as: PROTONIX  Take 40 mg by mouth 2 (two) times daily.   Simponi  Aria 50 MG/4ML Soln injection Generic drug: golimumab  Inject 2 mg into the vein every 8 (eight) weeks.  Infuse 2 mg/kg into a venous catheter every 8 (eight) weeks.   telmisartan 40 MG tablet Commonly known as: MICARDIS Take 40 mg by mouth daily.        Follow-up Information     [facility]. Schedule an appointment as soon as possible for a visit in 1 month(s).   Specialty: Neurology Contact information: [address] [place]  [address] [phone]               Discharge Exam: Fredricka Franco   02/27/23 1105   Weight: 93 kg   Vitals:   02/28/23 1148 02/28/23 1630  BP: 133/69 115/73  Pulse: 97 93  Resp: 17 17  Temp: 98.2 F (36.8 C) 98.2 F (36.8 C)  SpO2: 95% 98%   Examination: Physical Exam:  Constitutional: WN/WD obese Caucasian female in no acute distress appears calm back to her baseline Respiratory: Slightly diminished to auscultation bilaterally, no wheezing, rales, rhonchi or crackles. Normal respiratory effort and patient is not tachypenic. No accessory muscle use.  Unlabored breathing Cardiovascular: RRR, no murmurs / rubs / gallops. S1 and S2 auscultated. No extremity edema.  Abdomen: Soft, non-tender, distended secondary body habitus. Bowel sounds positive.  GU: Deferred. Musculoskeletal: No clubbing / cyanosis of digits/nails. No joint deformity upper and lower extremities.  Skin: No rashes, lesions, ulcers on limited skin evaluation. No induration; Warm and dry.  Neurologic: CN 2-12 grossly intact with no focal deficits. Romberg sign and  cerebellar reflexes not assessed.  Psychiatric: Normal judgment and insight. Alert and oriented x 3. Normal mood and appropriate affect.   Condition at discharge: stable  The results of significant diagnostics from this hospitalization (including imaging, microbiology, ancillary and laboratory) are listed below for reference.   Imaging Studies: ECHOCARDIOGRAM COMPLETE BUBBLE STUDY Result Date: 02/28/2023    ECHOCARDIOGRAM REPORT   Patient Name:   DEARIA WILMOUTH Date of Exam: 02/28/2023 Medical Rec #:  [id number]     Height:       64.0 in Accession #:    [id number]    Weight:       205.0 lb Date of Birth:  1970/10/06     BSA:          1.977 m Patient Age:    52 years      BP:           117/61 mmHg Patient Gender: F             HR:           89 bpm. Exam Location:  Inpatient Procedure: 2D Echo, 3D Echo, Cardiac Doppler, Color Doppler, Strain Analysis and            Saline Contrast Bubble Study Indications:    TIA  History:        Patient has no prior  history of Echocardiogram examinations.                 Risk Factors:Hypertension and Dyslipidemia.  Sonographer:    Ozell Free Referring Phys: [id number] JUSTIN B HOWERTER IMPRESSIONS  1. Left ventricular ejection fraction, by estimation, is 60 to 65%. The left ventricle has normal function. The left ventricle has no regional wall motion abnormalities. Left ventricular diastolic parameters are indeterminate.  2. Right ventricular systolic function is normal. The right ventricular size is normal. Tricuspid regurgitation signal is inadequate for assessing PA pressure.  3. The mitral valve is normal in structure. No evidence of mitral valve regurgitation. No evidence of mitral stenosis.  4. The aortic valve is normal in structure. Aortic valve regurgitation is not visualized. Aortic valve sclerosis is present, with no evidence of aortic valve stenosis.  5. The inferior vena cava is normal in size with greater than 50% respiratory variability, suggesting right atrial pressure of 3 mmHg. FINDINGS  Left Ventricle: Left ventricular ejection fraction, by estimation, is 60 to 65%. The left ventricle has normal function. The left ventricle has no regional wall motion abnormalities. The left ventricular internal cavity size was normal in size. There is  no left ventricular hypertrophy. Left ventricular diastolic parameters are indeterminate. Right Ventricle: The right ventricular size is normal. No increase in right ventricular wall thickness. Right ventricular systolic function is normal. Tricuspid regurgitation signal is inadequate for assessing PA pressure. The tricuspid regurgitant velocity is 2.65 m/s, and with an assumed right atrial pressure of 3 mmHg, the estimated right ventricular systolic pressure is 31.1 mmHg. Left Atrium: Left atrial size was normal in size. Right Atrium: Right atrial size was normal in size. Pericardium: There is no evidence of pericardial effusion. Presence of epicardial fat layer. Mitral Valve:  The mitral valve is normal in structure. No evidence of mitral valve regurgitation. No evidence of mitral valve stenosis. MV peak gradient, 5.3 mmHg. The mean mitral valve gradient is 3.0 mmHg. Tricuspid Valve: The tricuspid valve is normal in structure. Tricuspid valve regurgitation is not demonstrated. No evidence of tricuspid stenosis. Aortic Valve: The aortic valve is normal in  structure. Aortic valve regurgitation is not visualized. Aortic valve sclerosis is present, with no evidence of aortic valve stenosis. Aortic valve mean gradient measures 4.0 mmHg. Aortic valve peak gradient measures 6.9 mmHg. Aortic valve area, by VTI measures 2.00 cm. Pulmonic Valve: The pulmonic valve was normal in structure. Pulmonic valve regurgitation is not visualized. No evidence of pulmonic stenosis. Aorta: The aortic root is normal in size and structure. Venous: The inferior vena cava is normal in size with greater than 50% respiratory variability, suggesting right atrial pressure of 3 mmHg. IAS/Shunts: No atrial level shunt detected by color flow Doppler. Agitated saline contrast was given intravenously to evaluate for intracardiac shunting.  LEFT VENTRICLE PLAX 2D LVIDd:         3.90 cm   Diastology LVIDs:         2.70 cm   LV Chelsey Franco' medial:    6.20 cm/s LV PW:         0.90 cm   LV Chelsey Franco/Chelsey Franco' medial:  14.0 LV IVS:        0.80 cm   LV Chelsey Franco' lateral:   9.25 cm/s LVOT diam:     1.90 cm   LV Chelsey Franco/Chelsey Franco' lateral: 9.4 LV SV:         46 LV SV Index:   23 LVOT Area:     2.84 cm                           3D Volume EF:                          3D EF:        55 %                          LV EDV:       112 ml                          LV ESV:       51 ml                          LV SV:        62 ml RIGHT VENTRICLE             IVC RV Basal diam:  3.30 cm     IVC diam: 1.70 cm RV S prime:     13.70 cm/s TAPSE (M-mode): 2.5 cm LEFT ATRIUM             Index        RIGHT ATRIUM           Index LA diam:        4.00 cm 2.02 cm/m   RA Area:     13.60 cm LA Vol  (A2C):   40.3 ml 20.38 ml/m  RA Volume:   33.50 ml  16.94 ml/m LA Vol (A4C):   37.0 ml 18.72 ml/m LA Biplane Vol: 40.1 ml 20.28 ml/m  AORTIC VALVE AV Area (Vmax):    1.96 cm AV Area (Vmean):   1.98 cm AV Area (VTI):     2.00 cm AV Vmax:           131.00 cm/s AV Vmean:          88.400 cm/s AV VTI:  0.231 m AV Peak Grad:      6.9 mmHg AV Mean Grad:      4.0 mmHg LVOT Vmax:         90.70 cm/s LVOT Vmean:        61.800 cm/s LVOT VTI:          0.163 m LVOT/AV VTI ratio: 0.71  AORTA Ao Root diam: 3.10 cm Ao Asc diam:  2.40 cm MITRAL VALVE                TRICUSPID VALVE MV Area (PHT): 7.37 cm     TR Peak grad:   28.1 mmHg MV Area VTI:   2.28 cm     TR Vmax:        265.00 cm/s MV Peak grad:  5.3 mmHg MV Mean grad:  3.0 mmHg     SHUNTS MV Vmax:       1.15 m/s     Systemic VTI:  0.16 m MV Vmean:      79.3 cm/s    Systemic Diam: 1.90 cm MV Decel Time: 103 msec MR Peak grad: 74.3 mmHg MR Vmax:      431.00 cm/s MV Chelsey Franco velocity: 87.00 cm/s MV A velocity: 108.00 cm/s MV Chelsey Franco/A ratio:  0.81 Kardie Tobb DO Electronically signed by Dub Huntsman DO Signature Date/Time: 02/28/2023/12:38:58 PM    Final    CT ANGIO HEAD NECK W WO CM Result Date: 02/27/2023 CLINICAL DATA:  Stroke followup EXAM: CT ANGIOGRAPHY HEAD AND NECK WITH AND WITHOUT CONTRAST TECHNIQUE: Multidetector CT imaging of the head and neck was performed using the standard protocol during bolus administration of intravenous contrast. Multiplanar CT image reconstructions and MIPs were obtained to evaluate the vascular anatomy. Carotid stenosis measurements (when applicable) are obtained utilizing NASCET criteria, using the distal internal carotid diameter as the denominator. RADIATION DOSE REDUCTION: This exam was performed according to the departmental dose-optimization program which includes automated exposure control, adjustment of the mA and/or kV according to patient size and/or use of iterative reconstruction technique. CONTRAST:  75mL OMNIPAQUE  IOHEXOL  350  MG/ML SOLN COMPARISON:  None Available. FINDINGS: CTA NECK FINDINGS Skeleton: No acute abnormality or high grade bony spinal canal stenosis. Other neck: Normal pharynx, larynx and major salivary glands. No cervical lymphadenopathy. Unremarkable thyroid  gland. Upper chest: No pneumothorax or pleural effusion. No nodules or masses. Aortic arch: There is no calcific atherosclerosis of the aortic arch. Conventional 3 vessel aortic branching pattern. RIGHT carotid system: Normal without aneurysm, dissection or stenosis. LEFT carotid system: No dissection, occlusion or aneurysm. Mild atherosclerotic calcification at the carotid bifurcation without hemodynamically significant stenosis. Vertebral arteries: Codominant configuration. There is no dissection, occlusion or flow-limiting stenosis to the skull base (V1-V3 segments). CTA HEAD FINDINGS POSTERIOR CIRCULATION: Vertebral arteries are normal. No proximal occlusion of the anterior or inferior cerebellar arteries. Basilar artery is normal. Superior cerebellar arteries are normal. Posterior cerebral arteries are normal. ANTERIOR CIRCULATION: Intracranial internal carotid arteries are normal. Anterior cerebral arteries are normal. Middle cerebral arteries are normal. Venous sinuses: As permitted by contrast timing, patent. Anatomic variants: None Review of the MIP images confirms the above findings. IMPRESSION: 1. No emergent large vessel occlusion or hemodynamically significant stenosis of the head or neck. 2. Mild left carotid bifurcation atherosclerosis without hemodynamically significant stenosis. Electronically Signed   By: Franky Stanford M.D.   On: 02/27/2023 22:09   MR BRAIN W WO CONTRAST Result Date: 02/27/2023 CLINICAL DATA:  Right facial numbness. Left arm numbness. Abnormal sensation beginning  at 7:30 a.m. EXAM: MRI HEAD WITHOUT AND WITH CONTRAST TECHNIQUE: Multiplanar, multiecho pulse sequences of the brain and surrounding structures were obtained without and  with intravenous contrast. CONTRAST:  9mL GADAVIST  GADOBUTROL  1 MMOL/ML IV SOLN COMPARISON:  CT head without contrast 02/27/2023 at 11:24 a.m. MR head without contrast 09/02/2016 FINDINGS: Brain: No acute infarct, hemorrhage, or mass lesion is present. Deep brain nuclei are within normal limits. No significant white matter lesions are present. The ventricles are of normal size. No significant extraaxial fluid collection is present. The brainstem and cerebellum are within normal limits. The internal auditory canals are within normal limits. Midline structures are within normal limits. Postcontrast images demonstrate no pathologic enhancement. Vascular: Flow is present in the major intracranial arteries. Skull and upper cervical spine: The craniocervical junction is normal. Upper cervical spine is within normal limits. Marrow signal is unremarkable. Sinuses/Orbits: A right mastoid effusion is present. No obstructing nasopharyngeal lesion is present. The paranasal sinuses and mastoid air cells are otherwise clear. The globes and orbits are within normal limits. IMPRESSION: 1. Normal MRI appearance of the brain. No acute or focal lesion to explain the patient's symptoms. 2. Right mastoid effusion. No obstructing nasopharyngeal lesion is present. Electronically Signed   By: Lonni Necessary M.D.   On: 02/27/2023 19:13   CT HEAD CODE STROKE WO CONTRAST` Result Date: 02/27/2023 CLINICAL DATA:  Code stroke.  Tightness in the left arm and leg EXAM: CT HEAD WITHOUT CONTRAST TECHNIQUE: Contiguous axial images were obtained from the base of the skull through the vertex without intravenous contrast. RADIATION DOSE REDUCTION: This exam was performed according to the departmental dose-optimization program which includes automated exposure control, adjustment of the mA and/or kV according to patient size and/or use of iterative reconstruction technique. COMPARISON:  [facility] MR 09/02/16 FINDINGS: Brain: No evidence of acute  infarction, hemorrhage, hydrocephalus, extra-axial collection or mass lesion/mass effect. Vascular: No hyperdense vessel or unexpected calcification. Skull: Normal. Negative for fracture or focal lesion. Sinuses/Orbits: No middle ear or mastoid effusion. Paranasal sinuses are clear. Orbits are unremarkable. Other: None. ASPECTS [facility] Stroke Program Early CT Score): 10 IMPRESSION: No hemorrhage or CT evidence of an acute cortical infarct. Aspects is 10. Discussed with Dr. Tonia on 02/27/23 at 11:44 AM Electronically Signed   By: Lyndall Gore M.D.   On: 02/27/2023 11:44   Microbiology: Results for orders placed or performed in visit on 03/17/19  Novel Coronavirus, NAA ([facility])     Status: None   Collection Time: 03/17/19 10:25 AM   Specimen: Nasopharyngeal(NP) swabs in vial transport medium   NASOPHARYNGE  TESTING  Result Value Ref Range Status   SARS-CoV-2, NAA Not Detected Not Detected Final    Comment: This nucleic acid amplification test was developed and its performance characteristics determined by [employer]. Nucleic acid amplification tests include RT-PCR and TMA. This test has not been FDA cleared or approved. This test has been authorized by FDA under an Emergency Use Authorization (EUA). This test is only authorized for the duration of time the declaration that circumstances exist justifying the authorization of the emergency use of in vitro diagnostic tests for detection of SARS-CoV-2 virus and/or diagnosis of COVID-19 infection under section 564(b)(1) of the Act, 21 U.S.C. 639aaa-6(a) (1), unless the authorization is terminated or revoked sooner. When diagnostic testing is negative, the possibility of a false negative result should be considered in the context of a patient's recent exposures and the presence of clinical signs and symptoms consistent with COVID-19. An individual  without symptoms of COVID-19 and who is not shedding SARS-CoV-2 virus wo uld expect to  have a negative (not detected) result in this assay.    Labs: CBC: Recent Labs  Lab 02/27/23 1124 02/28/23 0634  WBC 8.4 8.2  NEUTROABS 5.4 4.3  HGB 14.4 13.2  HCT 42.6 39.6  MCV 95.7 96.4  PLT 438* 384   Basic Metabolic Panel: Recent Labs  Lab 02/27/23 1124 02/28/23 0016 02/28/23 0634  NA 137  --  140  K 3.8  --  4.1  CL 101  --  105  CO2 27  --  26  GLUCOSE 96  --  99  BUN 13  --  13  CREATININE 0.91  --  1.01*  CALCIUM  9.5  --  9.2  MG  --  2.1 2.2   Liver Function Tests: Recent Labs  Lab 02/27/23 1124 02/28/23 0634  AST 24 23  ALT 27 28  ALKPHOS 45 38  BILITOT 0.5 0.9  PROT 8.5* 7.1  ALBUMIN 4.6 3.5   CBG: Recent Labs  Lab 02/27/23 1151  GLUCAP 98   Discharge time spent: greater than 30 minutes.  Signed: Alejandro Marker, DO [facility] 03/02/2023

## 2023-02-28 NOTE — Progress Notes (Signed)
 PT Cancellation Note  Patient Details Name: Jahnya Trindade MRN: [id number] DOB: 02-20-1970   Cancelled Treatment:    Reason Eval/Treat Not Completed: PT screened, no needs identified, will sign off  Patient seen by OT and no PT needs identified.    Macario RAMAN, PT Acute Rehabilitation Services  Office [phone]   Macario SHAUNNA Soja 02/28/2023, 11:43 AM

## 2023-02-28 NOTE — TOC CM/SW Note (Signed)
 Transition of Care [facility]) - Inpatient Brief Assessment   Patient Details  Name: Chelsey Franco MRN: [id number] Date of Birth: 06/13/1970  [facility] CM/SW Contact:    Andrez JULIANNA George, RN Phone Number: 02/28/2023, 4:10 PM   Clinical Narrative:    Transition of Care Asessment: Insurance and Status: Insurance coverage has been reviewed Patient has primary care physician: Yes Home environment has been reviewed: home with spouse   Prior/Current Home Services: No current home services Social Drivers of Health Review: SDOH reviewed no interventions necessary Readmission risk has been reviewed: Yes Transition of care needs: no transition of care needs at this time

## 2023-02-28 NOTE — Progress Notes (Addendum)
 STROKE TEAM PROGRESS NOTE    SIGNIFICANT HOSPITAL EVENTS 2/5 patient admitted with transient episode of left arm and face numbness and tingling  INTERIM HISTORY/SUBJECTIVE  Patient has remained hemodynamically stable and afebrile.  She states that her symptoms have completely resolved.  OBJECTIVE  CBC    Component Value Date/Time   WBC 8.2 02/28/2023 0634   RBC 4.11 02/28/2023 0634   HGB 13.2 02/28/2023 0634   HCT 39.6 02/28/2023 0634   PLT 384 02/28/2023 0634   MCV 96.4 02/28/2023 0634   MCH 32.1 02/28/2023 0634   MCHC 33.3 02/28/2023 0634   RDW 12.9 02/28/2023 0634   LYMPHSABS 2.8 02/28/2023 0634   MONOABS 0.9 02/28/2023 0634   EOSABS 0.2 02/28/2023 0634   BASOSABS 0.1 02/28/2023 0634    BMET    Component Value Date/Time   NA 140 02/28/2023 0634   K 4.1 02/28/2023 0634   CL 105 02/28/2023 0634   CO2 26 02/28/2023 0634   GLUCOSE 99 02/28/2023 0634   BUN 13 02/28/2023 0634   CREATININE 1.01 (H) 02/28/2023 0634   CALCIUM  9.2 02/28/2023 0634   GFRNONAA >60 02/28/2023 0634    IMAGING past 24 hours ECHOCARDIOGRAM COMPLETE BUBBLE STUDY Result Date: 02/28/2023    ECHOCARDIOGRAM REPORT   Patient Name:   Chelsey Franco Date of Exam: 02/28/2023 Medical Rec #:  [id number]     Height:       64.0 in Accession #:    [id number]    Weight:       205.0 lb Date of Birth:  [birth year]     BSA:          1.977 m Patient Age:    52 years      BP:           117/61 mmHg Patient Gender: F             HR:           89 bpm. Exam Location:  Inpatient Procedure: 2D Echo, 3D Echo, Cardiac Doppler, Color Doppler, Strain Analysis and            Saline Contrast Bubble Study Indications:    TIA  History:        Patient has no prior history of Echocardiogram examinations.                 Risk Factors:Hypertension and Dyslipidemia.  Sonographer:    Ozell Free Referring Phys: [id number] JUSTIN B HOWERTER IMPRESSIONS  1. Left ventricular ejection fraction, by estimation, is 60 to 65%. The left ventricle has normal  function. The left ventricle has no regional wall motion abnormalities. Left ventricular diastolic parameters are indeterminate.  2. Right ventricular systolic function is normal. The right ventricular size is normal. Tricuspid regurgitation signal is inadequate for assessing PA pressure.  3. The mitral valve is normal in structure. No evidence of mitral valve regurgitation. No evidence of mitral stenosis.  4. The aortic valve is normal in structure. Aortic valve regurgitation is not visualized. Aortic valve sclerosis is present, with no evidence of aortic valve stenosis.  5. The inferior vena cava is normal in size with greater than 50% respiratory variability, suggesting right atrial pressure of 3 mmHg. FINDINGS  Left Ventricle: Left ventricular ejection fraction, by estimation, is 60 to 65%. The left ventricle has normal function. The left ventricle has no regional wall motion abnormalities. The left ventricular internal cavity size was normal in size. There is  no left ventricular hypertrophy. Left ventricular  diastolic parameters are indeterminate. Right Ventricle: The right ventricular size is normal. No increase in right ventricular wall thickness. Right ventricular systolic function is normal. Tricuspid regurgitation signal is inadequate for assessing PA pressure. The tricuspid regurgitant velocity is 2.65 m/s, and with an assumed right atrial pressure of 3 mmHg, the estimated right ventricular systolic pressure is 31.1 mmHg. Left Atrium: Left atrial size was normal in size. Right Atrium: Right atrial size was normal in size. Pericardium: There is no evidence of pericardial effusion. Presence of epicardial fat layer. Mitral Valve: The mitral valve is normal in structure. No evidence of mitral valve regurgitation. No evidence of mitral valve stenosis. MV peak gradient, 5.3 mmHg. The mean mitral valve gradient is 3.0 mmHg. Tricuspid Valve: The tricuspid valve is normal in structure. Tricuspid valve  regurgitation is not demonstrated. No evidence of tricuspid stenosis. Aortic Valve: The aortic valve is normal in structure. Aortic valve regurgitation is not visualized. Aortic valve sclerosis is present, with no evidence of aortic valve stenosis. Aortic valve mean gradient measures 4.0 mmHg. Aortic valve peak gradient measures 6.9 mmHg. Aortic valve area, by VTI measures 2.00 cm. Pulmonic Valve: The pulmonic valve was normal in structure. Pulmonic valve regurgitation is not visualized. No evidence of pulmonic stenosis. Aorta: The aortic root is normal in size and structure. Venous: The inferior vena cava is normal in size with greater than 50% respiratory variability, suggesting right atrial pressure of 3 mmHg. IAS/Shunts: No atrial level shunt detected by color flow Doppler. Agitated saline contrast was given intravenously to evaluate for intracardiac shunting.  LEFT VENTRICLE PLAX 2D LVIDd:         3.90 cm   Diastology LVIDs:         2.70 cm   LV e' medial:    6.20 cm/s LV PW:         0.90 cm   LV E/e' medial:  14.0 LV IVS:        0.80 cm   LV e' lateral:   9.25 cm/s LVOT diam:     1.90 cm   LV E/e' lateral: 9.4 LV SV:         46 LV SV Index:   23 LVOT Area:     2.84 cm                           3D Volume EF:                          3D EF:        55 %                          LV EDV:       112 ml                          LV ESV:       51 ml                          LV SV:        62 ml RIGHT VENTRICLE             IVC RV Basal diam:  3.30 cm     IVC diam: 1.70 cm RV S prime:     13.70 cm/s TAPSE (M-mode): 2.5 cm LEFT  ATRIUM             Index        RIGHT ATRIUM           Index LA diam:        4.00 cm 2.02 cm/m   RA Area:     13.60 cm LA Vol (A2C):   40.3 ml 20.38 ml/m  RA Volume:   33.50 ml  16.94 ml/m LA Vol (A4C):   37.0 ml 18.72 ml/m LA Biplane Vol: 40.1 ml 20.28 ml/m  AORTIC VALVE AV Area (Vmax):    1.96 cm AV Area (Vmean):   1.98 cm AV Area (VTI):     2.00 cm AV Vmax:           131.00 cm/s AV  Vmean:          88.400 cm/s AV VTI:            0.231 m AV Peak Grad:      6.9 mmHg AV Mean Grad:      4.0 mmHg LVOT Vmax:         90.70 cm/s LVOT Vmean:        61.800 cm/s LVOT VTI:          0.163 m LVOT/AV VTI ratio: 0.71  AORTA Ao Root diam: 3.10 cm Ao Asc diam:  2.40 cm MITRAL VALVE                TRICUSPID VALVE MV Area (PHT): 7.37 cm     TR Peak grad:   28.1 mmHg MV Area VTI:   2.28 cm     TR Vmax:        265.00 cm/s MV Peak grad:  5.3 mmHg MV Mean grad:  3.0 mmHg     SHUNTS MV Vmax:       1.15 m/s     Systemic VTI:  0.16 m MV Vmean:      79.3 cm/s    Systemic Diam: 1.90 cm MV Decel Time: 103 msec MR Peak grad: 74.3 mmHg MR Vmax:      431.00 cm/s MV E velocity: 87.00 cm/s MV A velocity: 108.00 cm/s MV E/A ratio:  0.81 Kardie Tobb DO Electronically signed by Dub Huntsman DO Signature Date/Time: 02/28/2023/12:38:58 PM    Final    CT ANGIO HEAD NECK W WO CM Result Date: 02/27/2023 CLINICAL DATA:  Stroke followup EXAM: CT ANGIOGRAPHY HEAD AND NECK WITH AND WITHOUT CONTRAST TECHNIQUE: Multidetector CT imaging of the head and neck was performed using the standard protocol during bolus administration of intravenous contrast. Multiplanar CT image reconstructions and MIPs were obtained to evaluate the vascular anatomy. Carotid stenosis measurements (when applicable) are obtained utilizing NASCET criteria, using the distal internal carotid diameter as the denominator. RADIATION DOSE REDUCTION: This exam was performed according to the departmental dose-optimization program which includes automated exposure control, adjustment of the mA and/or kV according to patient size and/or use of iterative reconstruction technique. CONTRAST:  75mL OMNIPAQUE  IOHEXOL  350 MG/ML SOLN COMPARISON:  None Available. FINDINGS: CTA NECK FINDINGS Skeleton: No acute abnormality or high grade bony spinal canal stenosis. Other neck: Normal pharynx, larynx and major salivary glands. No cervical lymphadenopathy. Unremarkable thyroid  gland. Upper  chest: No pneumothorax or pleural effusion. No nodules or masses. Aortic arch: There is no calcific atherosclerosis of the aortic arch. Conventional 3 vessel aortic branching pattern. RIGHT carotid system: Normal without aneurysm, dissection or stenosis. LEFT carotid system: No dissection, occlusion or aneurysm. Mild atherosclerotic  calcification at the carotid bifurcation without hemodynamically significant stenosis. Vertebral arteries: Codominant configuration. There is no dissection, occlusion or flow-limiting stenosis to the skull base (V1-V3 segments). CTA HEAD FINDINGS POSTERIOR CIRCULATION: Vertebral arteries are normal. No proximal occlusion of the anterior or inferior cerebellar arteries. Basilar artery is normal. Superior cerebellar arteries are normal. Posterior cerebral arteries are normal. ANTERIOR CIRCULATION: Intracranial internal carotid arteries are normal. Anterior cerebral arteries are normal. Middle cerebral arteries are normal. Venous sinuses: As permitted by contrast timing, patent. Anatomic variants: None Review of the MIP images confirms the above findings. IMPRESSION: 1. No emergent large vessel occlusion or hemodynamically significant stenosis of the head or neck. 2. Mild left carotid bifurcation atherosclerosis without hemodynamically significant stenosis. Electronically Signed   By: Franky Stanford M.D.   On: 02/27/2023 22:09   MR BRAIN W WO CONTRAST Result Date: 02/27/2023 CLINICAL DATA:  Right facial numbness. Left arm numbness. Abnormal sensation beginning at 7:30 a.m. EXAM: MRI HEAD WITHOUT AND WITH CONTRAST TECHNIQUE: Multiplanar, multiecho pulse sequences of the brain and surrounding structures were obtained without and with intravenous contrast. CONTRAST:  9mL GADAVIST  GADOBUTROL  1 MMOL/ML IV SOLN COMPARISON:  CT head without contrast 02/27/2023 at 11:24 a.m. MR head without contrast 09/02/2016 FINDINGS: Brain: No acute infarct, hemorrhage, or mass lesion is present. Deep brain  nuclei are within normal limits. No significant white matter lesions are present. The ventricles are of normal size. No significant extraaxial fluid collection is present. The brainstem and cerebellum are within normal limits. The internal auditory canals are within normal limits. Midline structures are within normal limits. Postcontrast images demonstrate no pathologic enhancement. Vascular: Flow is present in the major intracranial arteries. Skull and upper cervical spine: The craniocervical junction is normal. Upper cervical spine is within normal limits. Marrow signal is unremarkable. Sinuses/Orbits: A right mastoid effusion is present. No obstructing nasopharyngeal lesion is present. The paranasal sinuses and mastoid air cells are otherwise clear. The globes and orbits are within normal limits. IMPRESSION: 1. Normal MRI appearance of the brain. No acute or focal lesion to explain the patient's symptoms. 2. Right mastoid effusion. No obstructing nasopharyngeal lesion is present. Electronically Signed   By: Lonni Necessary M.D.   On: 02/27/2023 19:13    Vitals:   02/27/23 2347 02/28/23 0348 02/28/23 0742 02/28/23 1148  BP: 130/86 118/77 117/61 133/69  Pulse: 94 85 79 97  Resp:  16 17 17   Temp: 98.2 F (36.8 C) 97.7 F (36.5 C) 97.9 F (36.6 C) 98.2 F (36.8 C)  TempSrc: Oral Oral Oral Oral  SpO2: 97% 97% 97% 95%  Weight:      Height:         PHYSICAL EXAM General:  Alert, well-nourished, well-developed patient in no acute distress Psych:  Mood and affect appropriate for situation Respiratory:  Regular, unlabored respirations on room air  NEURO:  Mental Status: AA&Ox3, patient is able to give clear and coherent history Speech/Language: speech is without dysarthria or aphasia.  Naming, repetition, fluency, and comprehension intact.  Cranial Nerves:  II: PERRL. Visual fields full.  III, IV, VI: EOMI. Eyelids elevate symmetrically.  V: Sensation is intact to light touch and  symmetrical to face.  VII: Face is symmetrical resting and smiling VIII: hearing intact to voice. IX, X: Phonation is normal.  KP:Dynloizm shrug 5/5. XII: tongue is midline without fasciculations. Motor: 5/5 strength to all muscle groups tested.  Tone: is normal and bulk is normal Sensation- Intact to light touch bilaterally. Coordination: FTN intact  bilaterally Gait- deferred  Most Recent NIH 0  ASSESSMENT/PLAN  Ms. Chelsey Franco is a 53 y.o. female with history of hypertension, hyperlipidemia, sleep apnea on CPAP, hypothyroidism, TMJ and psoriasis admitted for a transient episode of left arm and facial numbness and tingling.  She did have a mild headache at the time the symptoms started which felt like her normal tension headache.  NIH on Admission 0  Likely complicated migraine, cannot completely rule out TIA given risk factors Code Stroke CT head No acute abnormality. ASPECTS 10.    CTA head & neck no LVO or hemodynamically significant stenosis, mild left carotid bifurcation atherosclerosis MRI no acute abnormality 2D Echo EF 60 to 65%, normal left atrial size, no atrial level shunt Hypercoagulable panel pending LDL 201 HgbA1c 5.5 VTE prophylaxis -SCDs No antithrombotic prior to admission, now on aspirin  81 mg daily and clopidogrel  75 mg daily for 3 weeks and then aspirin  alone. Therapy recommendations:  none Disposition: home  Hx of HA Occasional HA, denies photophobia or phonophobia Takes ibuprofen  which helps No hx of complicated migraine in the past  Hypertension Home meds: Amlodipine 5 mg daily, telmisartan 40 mg daily Stable Maintain normotension  Hyperlipidemia Home meds: None LDL 201, goal < 70 Add atorvastatin  80 mg daily Continue statin at discharge  Other Stroke Risk Factors Obesity, Body mass index is 35.19 kg/m., BMI >/= 30 associated with increased stroke risk, recommend weight loss, diet and exercise as appropriate  OSA on CPAP  Other Active  Problems TMJ psoriasis  Hospital day # 0  Patient seen by NP and then by MD, MD to edit note as needed. Cortney E Everitt Clint Kill , MSN, AGACNP-BC [facility] See Amion for schedule and pager information 02/28/2023 2:42 PM  ATTENDING NOTE: I reviewed above note and agree with the assessment and plan. Pt was seen and examined.   Husband at bedside.  Patient stated that she had headache in the past but not frequent, lasting hours but denies photophobia or phonophobia, takes ibuprofen  which effective.  This time she does have bifrontal headache also involving vertex.  Not too bad, 3-4/10.  Was started to have left face and arm numbness tingling but not in the left.  Currently resolved.  Neuroexam intact.  Stroke workup negative so far except LDL was high.  Patient does have risk factors for TIA, but current episode still more likely complicated migraine for complicated headache.  Will continue DAPT for 3 weeks and then aspirin .  Put on high-dose statin.  PT and OT no recommendation.  Discussed with Dr. Sherrill.  For detailed assessment and plan, please refer to above/below as I have made changes wherever appropriate.   Neurology will sign off. Please call with questions. Pt will follow up with stroke clinic NP at [facility] in about 4 weeks. Thanks for the consult.   Ary Cummins, MD PhD Stroke Neurology 02/28/2023 4:12 PM      To contact Stroke Continuity provider, please refer to [email or web address]. After hours, contact General Neurology

## 2023-02-28 NOTE — Plan of Care (Signed)
  Problem: Education: Goal: Knowledge of disease or condition will improve Outcome: Progressing   Problem: Ischemic Stroke/TIA Tissue Perfusion: Goal: Complications of ischemic stroke/TIA will be minimized Outcome: Progressing   Problem: Health Behavior/Discharge Planning: Goal: Ability to manage health-related needs will improve Outcome: Progressing Goal: Goals will be collaboratively established with patient/family Outcome: Progressing

## 2023-02-28 NOTE — Progress Notes (Signed)
 SLP Cancellation Note  Patient Details Name: Chelsey Franco MRN: [id number] DOB: September 16, 1970   Cancelled treatment:       Reason Eval/Treat Not Completed: SLP screened, no needs identified, will sign off; screened and s/o; MRI negative; symptoms resolved per pt report.  No further ST recommended.   Pat Nayelly Laughman,M.S.,CCC-SLP 02/28/2023, 11:16 AM

## 2023-02-28 NOTE — Evaluation (Signed)
 Occupational Therapy Evaluation Patient Details Name: Chelsey Franco MRN: [id number] DOB: April 18, 1970 Today's Date: 02/28/2023   History of Present Illness 53 y.o. female presenting from home complaining of left face and upper extremity numbness. MRI (-). Suspected TIA. PMH: essential hypertension, acquired hypothyroidism, depression.   Clinical Impression   Pt lives independently with husband and child and works in audiological scientist. States she has been under a lot of stress late.y. symptoms appear to have resolved. Completed education of waring signs/symptoms of stroke using [employer]. Pt/husband verbalized understanding. No further OT needed.       If plan is discharge home, recommend the following:      Functional Status Assessment  Patient has not had a recent decline in their functional status  Equipment Recommendations  None recommended by OT    Recommendations for Other Services       Precautions / Restrictions Precautions Precautions: None      Mobility Bed Mobility Overal bed mobility: Independent                  Transfers Overall transfer level: Independent                        Balance Overall balance assessment: Independent                                         ADL either performed or assessed with clinical judgement   ADL Overall ADL's : At baseline                                             Vision Baseline Vision/History: 0 No visual deficits;1 Wears glasses Vision Assessment?: No apparent visual deficits     Perception         Praxis         Pertinent Vitals/Pain Pain Assessment Pain Assessment: No/denies pain     Extremity/Trunk Assessment Upper Extremity Assessment Upper Extremity Assessment: Overall WFL for tasks assessed   Lower Extremity Assessment Lower Extremity Assessment: Overall WFL for tasks assessed   Cervical / Trunk Assessment Cervical / Trunk Assessment: Normal    Communication Communication Communication: No apparent difficulties   Cognition Arousal: Alert Behavior During Therapy: WFL for tasks assessed/performed Overall Cognitive Status: Within Functional Limits for tasks assessed                                       General Comments       Exercises     Shoulder Instructions      Home Living Family/patient expects to be discharged to:: Private residence Living Arrangements: Spouse/significant other;Children Available Help at Discharge: Family;Available 24 hours/day Type of Home: House Home Access: Stairs to enter [employer] of Steps: 2   Home Layout: Two level;Bed/bath upstairs     Bathroom Shower/Tub: Producer, Television/film/video: Standard Bathroom Accessibility: Yes How Accessible: Accessible via walker Home Equipment: Rolling Walker (2 wheels)          Prior Functioning/Environment Prior Level of Function : Independent/Modified Independent;Driving;Working/employed (works an as airline pilot)  OT Problem List:        OT Treatment/Interventions:      OT Goals(Current goals can be found in the care plan section) Acute Rehab OT Goals Patient Stated Goal: find out why this happened OT Goal Formulation: All assessment and education complete, DC therapy  OT Frequency:      Co-evaluation              AM-PAC OT 6 Clicks Daily Activity     Outcome Measure Help from another person eating meals?: None Help from another person taking care of personal grooming?: None Help from another person toileting, which includes using toliet, bedpan, or urinal?: None Help from another person bathing (including washing, rinsing, drying)?: None Help from another person to put on and taking off regular upper body clothing?: None Help from another person to put on and taking off regular lower body clothing?: None 6 Click Score: 24   End of Session Nurse Communication:  Mobility status  Activity Tolerance: Patient tolerated treatment well Patient left: in bed;with call bell/phone within reach;with family/visitor present  OT Visit Diagnosis: Other (comment) (LUE numbness)                Time: 8947-8889 OT Time Calculation (min): 18 min Charges:  OT General Charges $OT Visit: 1 Visit OT Evaluation $OT Eval Low Complexity: 1 Low  Iracema Lanagan, OT/L   Acute OT Clinical Specialist Acute Rehabilitation Services Pager [phone] Office [phone]   [facility] 02/28/2023, 12:27 PM

## 2023-02-28 NOTE — Progress Notes (Signed)
  Echocardiogram 2D Echocardiogram has been performed.  Chelsey Franco 02/28/2023, 10:08 AM

## 2023-03-01 LAB — PROTEIN C ACTIVITY: Protein C Activity: 142 % (ref 73–180)

## 2023-03-01 LAB — PROTEIN S ACTIVITY: Protein S Activity: 116 % (ref 63–140)

## 2023-03-01 LAB — PROTEIN S, TOTAL: Protein S Ag, Total: 90 % (ref 60–150)

## 2023-03-02 LAB — BETA-2-GLYCOPROTEIN I ABS, IGG/M/A
Beta-2 Glyco I IgG: <9 GPI IgG units (ref 0–20)
Beta-2-Glycoprotein I IgA: <9 GPI IgA units (ref 0–25)
Beta-2-Glycoprotein I IgM: <9 GPI IgM units (ref 0–32)

## 2023-03-02 LAB — LUPUS ANTICOAGULANT PANEL
DRVVT: 36.4 s (ref 0.0–47.0)
PTT Lupus Anticoagulant: 38.4 s (ref 0.0–43.5)

## 2023-03-02 LAB — HOMOCYSTEINE: Homocysteine: 8.1 umol/L (ref 0.0–14.5)

## 2023-03-02 LAB — CARDIOLIPIN ANTIBODIES, IGG, IGM, IGA
Anticardiolipin IgA: <9 [APL'U]/mL (ref 0–11)
Anticardiolipin IgG: <9 [GPL'U]/mL (ref 0–14)
Anticardiolipin IgM: <9 [MPL'U]/mL (ref 0–12)

## 2023-03-02 LAB — PROTEIN C, TOTAL: Protein C, Total: 86 % (ref 60–150)

## 2023-03-02 NOTE — Hospital Course (Addendum)
 The patient is a 53 year old Caucasian female with a past medical history significant for Mannam to essential hypertension, acquired hypothyroidism, depression as well as other comorbidities who presented to the hospital with left arm numbness after she developed sudden left arm numbness and tingling and left lower extremity over the left portion of her face.  The symptoms remained constant for more than 2 hours then completely resolved in fashion.  She denied any associated focal weakness dysphagia, nausea or vomiting.  Denied any history of TIA or stroke.  She was admitted for further stroke evaluation and neurology feels that she may have had a complicated migraine but given that she has risk factors for TIA they placed her on dual antiplatelet therapy for 3 weeks and then just aspirin  alone as well as high-dose statin.  At this time her workup is medically and she is medically stable for discharge and need to follow-up with PCP and neurology in outpatient setting.  Assessment and Plan:  Likely complicated migraine -Cannot effectively rule out TIA given her risk factors -Head CT stroke showed no acute abnormality and CTA of the head neck showed no LVO or hemodynamically significant stenosis but did show a mild left carotid bifurcation atherosclerosis -MRI done and showed no acute abnormality -TTE with bubble study is showing an EF of 60 to 65% -Hypercoagulable panel came back and was within normal limits -LDL was elevated at 201 and she was initiated on high-dose statin with atorvastatin  80 minutes p.o. daily -Hemoglobin A1c was 5.5 -PT OT recommended no follow-up -Her symptoms completely resolved and has been placed on dual antiplatelet therapy for 3 weeks with aspirin  Plavix  and then just aspirin  alone  Essential hypertension -Continue with amlodipine 5 mg p.o. daily and telmisartan 40 g p.o. daily and maintain normotension at home  Hypothyroidism which is acquired -Continue with  levothyroxine    Depression and anxiety -Continue Wellbutrin  and Celexa    GERD/GI prophylaxis -Continue with PPI with pantoprazole   HLD -Patient is a total cholesterol/HDL ratio 4.6, cholesterol level 287, LDL 51, HDL 62, triglycerides 119 and VLDL 24 -Initiated on atorvastatin  80 mg p.o. daily  Class II Obesity -Complicates overall prognosis and care -Estimated body mass index is 35.19 kg/m as calculated from the following:   Height as of this encounter: 5' 4 (1.626 m).   Weight as of this encounter: 93 kg.  -Weight Loss and Dietary Counseling given

## 2023-03-04 DIAGNOSIS — L4050 Arthropathic psoriasis, unspecified: Secondary | ICD-10-CM | POA: Diagnosis not present

## 2023-03-06 DIAGNOSIS — M79643 Pain in unspecified hand: Secondary | ICD-10-CM | POA: Diagnosis not present

## 2023-03-06 DIAGNOSIS — L409 Psoriasis, unspecified: Secondary | ICD-10-CM | POA: Diagnosis not present

## 2023-03-06 DIAGNOSIS — Z79899 Other long term (current) drug therapy: Secondary | ICD-10-CM | POA: Diagnosis not present

## 2023-03-06 DIAGNOSIS — L4050 Arthropathic psoriasis, unspecified: Secondary | ICD-10-CM | POA: Diagnosis not present

## 2023-03-06 LAB — FACTOR 5 LEIDEN

## 2023-03-06 LAB — PROTHROMBIN GENE MUTATION

## 2023-03-12 DIAGNOSIS — G4733 Obstructive sleep apnea (adult) (pediatric): Secondary | ICD-10-CM | POA: Diagnosis not present

## 2023-03-29 DIAGNOSIS — G459 Transient cerebral ischemic attack, unspecified: Secondary | ICD-10-CM | POA: Diagnosis not present

## 2023-03-29 DIAGNOSIS — G43109 Migraine with aura, not intractable, without status migrainosus: Secondary | ICD-10-CM | POA: Diagnosis not present

## 2023-04-02 DIAGNOSIS — Z713 Dietary counseling and surveillance: Secondary | ICD-10-CM | POA: Diagnosis not present

## 2023-04-14 DIAGNOSIS — S0185XA Open bite of other part of head, initial encounter: Secondary | ICD-10-CM | POA: Diagnosis not present

## 2023-04-17 DIAGNOSIS — G4733 Obstructive sleep apnea (adult) (pediatric): Secondary | ICD-10-CM | POA: Diagnosis not present

## 2023-04-29 DIAGNOSIS — I10 Essential (primary) hypertension: Secondary | ICD-10-CM | POA: Diagnosis not present

## 2023-04-29 DIAGNOSIS — E039 Hypothyroidism, unspecified: Secondary | ICD-10-CM | POA: Diagnosis not present

## 2023-04-29 DIAGNOSIS — Z0000 Encounter for general adult medical examination without abnormal findings: Secondary | ICD-10-CM | POA: Diagnosis not present

## 2023-04-29 DIAGNOSIS — L4050 Arthropathic psoriasis, unspecified: Secondary | ICD-10-CM | POA: Diagnosis not present

## 2023-04-29 DIAGNOSIS — D509 Iron deficiency anemia, unspecified: Secondary | ICD-10-CM | POA: Diagnosis not present

## 2023-04-29 DIAGNOSIS — E782 Mixed hyperlipidemia: Secondary | ICD-10-CM | POA: Diagnosis not present

## 2023-04-30 DIAGNOSIS — M19071 Primary osteoarthritis, right ankle and foot: Secondary | ICD-10-CM | POA: Diagnosis not present

## 2023-04-30 DIAGNOSIS — M1711 Unilateral primary osteoarthritis, right knee: Secondary | ICD-10-CM | POA: Diagnosis not present

## 2023-05-01 DIAGNOSIS — Z713 Dietary counseling and surveillance: Secondary | ICD-10-CM | POA: Diagnosis not present

## 2023-05-10 DIAGNOSIS — J029 Acute pharyngitis, unspecified: Secondary | ICD-10-CM | POA: Diagnosis not present

## 2023-05-16 DIAGNOSIS — Z713 Dietary counseling and surveillance: Secondary | ICD-10-CM | POA: Diagnosis not present

## 2023-05-23 DIAGNOSIS — H16223 Keratoconjunctivitis sicca, not specified as Sjogren's, bilateral: Secondary | ICD-10-CM | POA: Diagnosis not present

## 2023-05-28 ENCOUNTER — Ambulatory Visit (INDEPENDENT_AMBULATORY_CARE_PROVIDER_SITE_OTHER): Admitting: Neurology

## 2023-05-28 ENCOUNTER — Encounter: Payer: Self-pay | Admitting: Neurology

## 2023-05-28 VITALS — BP 127/86 | HR 96 | Ht 64.0 in | Wt 214.8 lb

## 2023-05-28 DIAGNOSIS — R2990 Unspecified symptoms and signs involving the nervous system: Secondary | ICD-10-CM

## 2023-05-28 NOTE — Progress Notes (Signed)
 [facility]    Provider:  Dr Tresia Fruit Requesting Provider: Consuelo Denmark, MD Primary Care Provider:  Annabell Key, [place]  E, PA  CC:  TIA vs Migraine  HPI:  Chelsey Franco is a 53 y.o. female here as requested by Consuelo Denmark, MD for migraine with aura. has Arthritis of carpometacarpal (CMC) joint of right thumb; Degenerative tear of triangular fibrocartilage complex (TFCC) of right wrist; Low back pain; Osteoarthritis; Other long term (current) drug therapy; Psoriatic arthritis (HCC); Tear of left lunotriquetral ligament; Abnormal uterine bleeding (AUB); Endometrial mass; Allergic rhinitis; History of penicillin allergy; Anxiety; Daytime somnolence; Essential hypertension; GERD (gastroesophageal reflux disease); Acquired hypothyroidism; Iron deficiency anemia; Interstitial cystitis; Major depressive disorder, single episode, in full remission (HCC); Menopausal and female climacteric states; Muscle contraction headache; Obesity; Obstructive sleep apnea (adult) (pediatric); Other specified conditions associated with female genital organs and menstrual cycle; Mixed hyperlipidemia; Pure hypercholesterolemia; Depression; Recurrent sinusitis; Restless legs syndrome; Simple chronic bronchitis (HCC); Stress; 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; TIA (transient ischemic attack); Left facial numbness; and Left arm numbness on their problem list.  Patient seen at [facility] for likely complicated migraine but cannot rule out IA given risk factors. Chelsey Franco is a 53 y.o. female with history of hypertension, hyperlipidemia, sleep apnea on CPAP, hypothyroidism, TMJ and psoriasis admitted for a transient episode of left arm and facial numbness and tingling.  She did have a mild headache at the time the symptoms started which felt like her normal tension headache.  NIH on Admission 0.   Patient states she had a bad headache after being in the ED all day, didn't have a headache with the symptoms,  headaches can be pulsating/throbbing but no light or sound sensitivity or nausea, no fhx of migraines. She denies having any aura but not in the setting of headache. She thinks it may have been stress, she was under a lot of stress. She quit her job because of this event and feels better. Now that the stress is reduced she feels much better and no residual symptoms. She is meeting with a nutritionist and her ldl is significantly improved. Dad had TIA and grandmothers had strokes. She denies any chest pain or shortness of breath or palpitations.   Medications tried that can be used in migraine/headache management greater than 3 months include: Lifestyle modification, headache diaries, better sleep hygiene, exercise, management of migraine triggers. Amlodipine. Atenolol. On celxa, amitriptyline/nortriptyline contraindicated due to risk of seratonin syndrome in combination with celexa . Triptans contraindicated due to TIA.   Reviewed notes, labs and imaging from outside physicians, which showed:  02/27/2023: MRI brain   IMPRESSION: 1. Normal MRI appearance of the brain. No acute or focal lesion to explain the patient's symptoms. 2. Right mastoid effusion. No obstructing nasopharyngeal lesion is present.  CTA H&N IMPRESSION: 1. No emergent large vessel occlusion or hemodynamically significant stenosis of the head or neck. 2. Mild left carotid bifurcation atherosclerosis without hemodynamically significant stenosis.   Review of Systems: Patient complains of symptoms per HPI as well as the following symptoms none. Pertinent negatives and positives per HPI. All others negative.   Social History   Socioeconomic History   Marital status: Married    Spouse name: Not on file   Number of children: Not on file   Years of education: Not on file   Highest education level: Not on file  Occupational History   Not on file  Tobacco Use   Smoking status: Never   Smokeless tobacco: Never  Substance  and Sexual Activity   Alcohol use: Not Currently    Comment: 1 amonth   Drug use: No   Sexual activity: Not on file  Other Topics Concern   Not on file  Social History Narrative   Not on file   Social Drivers of Health   Financial Resource Strain: Low Risk  (12/04/2021)   Overall Financial Resource Strain (CARDIA)    Difficulty of Paying Living Expenses: Not hard at all  Food Insecurity: No Food Insecurity (02/28/2023)   Hunger Vital Sign    Worried About Running Out of Food in the Last Year: Never true    Ran Out of Food in the Last Year: Never true  Transportation Needs: No Transportation Needs (02/28/2023)   PRAPARE - Administrator, Civil Service (Medical): No    Lack of Transportation (Non-Medical): No  Physical Activity: Not on file  Stress: Not on file  Social Connections: Unknown (06/21/2022)   Received from [facility]   Social Network    Social Network: Not on file  Intimate Partner Violence: Not At Risk (02/28/2023)   Humiliation, Afraid, Rape, and Kick questionnaire    Fear of Current or Ex-Partner: No    Emotionally Abused: No    Physically Abused: No    Sexually Abused: No    History reviewed. No pertinent family history.  Past Medical History:  Diagnosis Date   Anemia    has had for few yrs takes iron 09/12/2020   Arthritis    soriatic 09/12/2020   Depression    has had it for 20 yrs 09/12/2020   Hypertension    Hypothyroid    Sleep apnea    wears a mouth gua 08/22/2022rd    Patient Active Problem List   Diagnosis Date Noted   Left facial numbness 02/28/2023   Left arm numbness 02/28/2023   TIA (transient ischemic attack) 02/27/2023   Allergic rhinitis 08/28/2021   History of penicillin allergy 08/28/2021   Anxiety 08/28/2021   Daytime somnolence 08/28/2021   Essential hypertension 08/28/2021   GERD (gastroesophageal reflux disease) 08/28/2021   Acquired hypothyroidism 08/28/2021   Iron deficiency anemia 08/28/2021   Interstitial  cystitis 08/28/2021   Major depressive disorder, single episode, in full remission (HCC) 08/28/2021   Menopausal and female climacteric states 08/28/2021   Muscle contraction headache 08/28/2021   Obesity 08/28/2021   Obstructive sleep apnea (adult) (pediatric) 08/28/2021   Other specified conditions associated with female genital organs and menstrual cycle 08/28/2021   Mixed hyperlipidemia 08/28/2021   Pure hypercholesterolemia 08/28/2021   Depression 08/28/2021   Recurrent sinusitis 08/28/2021   Restless legs syndrome 08/28/2021   Simple chronic bronchitis (HCC) 08/28/2021   Stress 08/28/2021   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 08/28/2021   Osteoarthritis 09/03/2020   Other long term (current) drug therapy 09/03/2020   Psoriatic arthritis (HCC) 09/03/2020   Abnormal uterine bleeding (AUB) 09/03/2020   Endometrial mass 09/03/2020   Low back pain 05/05/2019   Arthritis of carpometacarpal (CMC) joint of right thumb 06/17/2018   Degenerative tear of triangular fibrocartilage complex (TFCC) of right wrist 06/17/2018   Tear of left lunotriquetral ligament 06/17/2018    Past Surgical History:  Procedure Laterality Date   DILATATION & CURETTAGE/HYSTEROSCOPY WITH MYOSURE N/A 09/14/2020   Procedure: DILATATION & CURETTAGE/HYSTEROSCOPY WITH MYOSURE;  Surgeon: Meldon Sport, DO;  Location: [facility];  Service: Gynecology;  Laterality: N/A;   INTRAUTERINE DEVICE (IUD) INSERTION N/A 09/14/2020   Procedure: INTRAUTERINE DEVICE (  IUD) INSERTION;  Surgeon: Meldon Sport, DO;  Location: [facility] [place];  Service: Gynecology;  Laterality: N/A;   KNEE ARTHROSCOPY Left    3 surgery on knee 09/12/2020   NASAL SEPTOPLASTY W/ TURBINOPLASTY      Current Outpatient Medications  Medication Sig Dispense Refill   acetaminophen  (TYLENOL ) 325 MG tablet Take 2 tablets (650 mg total) by mouth every 6 (six) hours as needed for mild pain (pain score 1-3) (or Fever >/= 101). 20  tablet 0   amLODipine (NORVASC) 5 MG tablet Take 5 mg by mouth daily.     aspirin  EC 81 MG tablet Take 1 tablet (81 mg total) by mouth daily. Swallow whole. 30 tablet 12   atorvastatin  (LIPITOR) 80 MG tablet Take 1 tablet (80 mg total) by mouth daily. 30 tablet 0   buPROPion  (WELLBUTRIN  XL) 150 MG 24 hr tablet Take 150 mg by mouth every morning.     citalopram  (CELEXA ) 20 MG tablet Take 20 mg by mouth daily.     folic acid (FOLVITE) 1 MG tablet Take 1 mg by mouth daily.     GEMTESA 75 MG TABS Take 1 tablet by mouth daily.     golimumab  (SIMPONI  ARIA) 50 MG/4ML SOLN injection Inject 2 mg into the vein every 8 (eight) weeks.  Infuse 2 mg/kg into a venous catheter every 8 (eight) weeks.     Iron Carbonyl-Vitamin C-FOS (CHEWABLE IRON PO) Take by mouth.     levothyroxine  (SYNTHROID , LEVOTHROID) 100 MCG tablet Take 100 mcg by mouth daily before breakfast.     methotrexate 50 MG/2ML injection Inject 25 mg into the muscle once a week. Inject 1 ml 25mg /ml     pantoprazole  (PROTONIX ) 40 MG tablet Take 40 mg by mouth 2 (two) times daily.     telmisartan (MICARDIS) 40 MG tablet Take 40 mg by mouth daily.     ferrous sulfate 324 MG TBEC Take 324 mg by mouth daily with breakfast.     No current facility-administered medications for this visit.    Allergies as of 05/28/2023   (No Known Allergies)    Vitals: BP 127/86   Pulse 96   Ht 5\' 4"  (1.626 m)   Wt 214 lb 12.8 oz (97.4 kg)   BMI 36.87 kg/m  Last Weight:  Wt Readings from Last 1 Encounters:  05/28/23 214 lb 12.8 oz (97.4 kg)   Last Height:   Ht Readings from Last 1 Encounters:  05/28/23 5\' 4"  (1.626 m)     Physical exam: Exam: Gen: NAD, conversant, well nourised, obese, well groomed                     CV: RRR, no MRG. No Carotid Bruits. No peripheral edema, warm, nontender Eyes: Conjunctivae clear without exudates or hemorrhage  Neuro: Detailed Neurologic Exam  Speech:    Speech is normal; fluent and spontaneous with normal  comprehension.  Cognition:    The patient is oriented to person, place, and time;     recent and remote memory intact;     language fluent;     normal attention, concentration,     fund of knowledge Cranial Nerves:    The pupils are equal, round, and reactive to light. The fundi are normal and spontaneous venous pulsations are present. Visual fields are full to finger confrontation. Extraocular movements are intact. Trigeminal sensation is intact and the muscles of mastication are normal. The face is symmetric. The palate elevates in the  midline. Hearing intact. Voice is normal. Shoulder shrug is normal. The tongue has normal motion without fasciculations.   Coordination: nml  Gait: nml  Motor Observation:    No asymmetry, no atrophy, and no involuntary movements noted. Tone:    Normal muscle tone.    Posture:    Posture is normal. normal erect    Strength:    Strength is V/V in the upper and lower limbs.      Sensation: intact to LT     Reflex Exam:  DTR's:    Deep tendon reflexes in the upper and lower extremities are normal bilaterally.   Toes:    The toes are downgoing bilaterally.   Clonus:    Clonus is absent.    Assessment/Plan:  Patient seen at [facility] for likely complicated migraine vs stress reaction but cannot rule out TIA given risk factors. Chelsey Franco is a 53 y.o. female with history of hypertension, hyperlipidemia, sleep apnea on CPAP, hypothyroidism, TMJ and psoriasis admitted for a transient episode of left arm and facial numbness and tingling.  She did have a mild headache at the time the symptoms started which felt like her normal tension headache.  NIH on Admission 0.   Likely complicated migraine, cannot completely rule out TIA given risk factors CTA head & neck no LVO or hemodynamically significant stenosis, mild left carotid bifurcation atherosclerosis MRI no acute abnormality 2D Echo EF 60 to 65%, normal left atrial size, no atrial level  shunt Hypercoagulable panel normal LDL 201 goal < 70 added atorvastatin  HgbA1c 5.5 No antithrombotic prior to admission, placed on aspirin  81 mg daily and clopidogrel  75 mg daily for 3 weeks and then aspirin  alone. Obesity BMI 35 recommend weight loss and continued treatment of OSA Triptans contraindicated Manage blood pressure goal normotensive follow with pcp Discussed  risk for stroke, stroke prevention, personally independently reviewed imaging studies and stroke evaluation results and answered questions.Continue asa and maintain strict control of hypertension with blood pressure goal below 130/90, diabetes with hemoglobin A1c goal below 6.5% and lipids with LDL cholesterol goal below 70 mg/dL. I also advised the patient to eat a healthy diet with plenty of whole grains, cereals, fruits and vegetables, lean protein, exercise regularly and maintain ideal body weight .Followup in the future as needed.   Cc: Consuelo Denmark, MD,  Annabell Key, [place]  E, PA  Aldona Amel, MD  [facility] [address] [place], [place] [address]  Phone [phone] Fax [phone]

## 2023-05-28 NOTE — Patient Instructions (Addendum)
 Healthy brain study MIND Diet [employer] "XX Brain" Chelsey Franco    Discussed:  There is increased risk for stroke in women with migraine with aura and a contraindication for the combined contraceptive pill for use by women who have migraine with aura. The risk for women with migraine without aura is lower. However other risk factors like smoking are far more likely to increase stroke risk than migraine. There is a recommendation for no smoking and for the use of OCPs without estrogen such as progestogen only pills particularly for women with migraine with aura.[name] People who have migraine headaches with auras may be 3 times more likely to have a stroke caused by a blood clot, compared to migraine patients who don't see auras. Women who take hormone-replacement therapy may be 30 percent more likely to suffer a clot-based stroke than women not taking medication containing estrogen. Other risk factors like smoking and high blood pressure may be  much more important. And stroke is still a rare complication due to migraine aura and is controversial and lower doses may not cause a risk.   Stroke Prevention Some medical conditions and behaviors can lead to a higher chance of having a stroke. You can help prevent a stroke by eating healthy, exercising, not smoking, and managing any medical conditions you have. Stroke is a leading cause of functional impairment. Primary prevention is particularly important because a majority of strokes are first-time events. Stroke changes the lives of not only those who experience a stroke but also their family and other caregivers. How can this condition affect me? A stroke is a medical emergency and should be treated right away. A stroke can lead to brain damage and can sometimes be life-threatening. If a person gets medical treatment right away, there is a better chance of surviving and recovering from a stroke. What can increase my risk? The following medical  conditions may increase your risk of a stroke: Cardiovascular disease. High blood pressure (hypertension). Diabetes. High cholesterol. Sickle cell disease. Blood clotting disorders (hypercoagulable state). Obesity. Sleep disorders (obstructive sleep apnea). Other risk factors include: Being older than age 22. Having a history of blood clots, stroke, or mini-stroke (transient ischemic attack, TIA). Genetic factors, such as race, ethnicity, or a family history of stroke. Smoking cigarettes or using other tobacco products. Taking birth control pills, especially if you also use tobacco. Heavy use of alcohol or drugs, especially cocaine and methamphetamine. Physical inactivity. What actions can I take to prevent this? Manage your health conditions High cholesterol levels. Eating a healthy diet is important for preventing high cholesterol. If cholesterol cannot be managed through diet alone, you may need to take medicines. Take any prescribed medicines to control your cholesterol as told by your health care provider. Hypertension. To reduce your risk of stroke, try to keep your blood pressure below 130/80. Eating a healthy diet and exercising regularly are important for controlling blood pressure. If these steps are not enough to manage your blood pressure, you may need to take medicines. Take any prescribed medicines to control hypertension as told by your health care provider. Ask your health care provider if you should monitor your blood pressure at home. Have your blood pressure checked every year, even if your blood pressure is normal. Blood pressure increases with age and some medical conditions. Diabetes. Eating a healthy diet and exercising regularly are important parts of managing your blood sugar (glucose). If your blood sugar cannot be managed through diet and exercise, you may need  to take medicines. Take any prescribed medicines to control your diabetes as told by your health  care provider. Get evaluated for obstructive sleep apnea. Talk to your health care provider about getting a sleep evaluation if you snore a lot or have excessive sleepiness. Make sure that any other medical conditions you have, such as atrial fibrillation or atherosclerosis, are managed. Nutrition Follow instructions from your health care provider about what to eat or drink to help manage your health condition. These instructions may include: Reducing your daily calorie intake. Limiting how much salt (sodium) you use to 1,500 milligrams (mg) each day. Using only healthy fats for cooking, such as olive oil, canola oil, or sunflower oil. Eating healthy foods. You can do this by: Choosing foods that are high in fiber, such as whole grains, and fresh fruits and vegetables. Eating at least 5 servings of fruits and vegetables a day. Try to fill one-half of your plate with fruits and vegetables at each meal. Choosing lean protein foods, such as lean cuts of meat, poultry without skin, fish, tofu, beans, and nuts. Eating low-fat dairy products. Avoiding foods that are high in sodium. This can help lower blood pressure. Avoiding foods that have saturated fat, trans fat, and cholesterol. This can help prevent high cholesterol. Avoiding processed and prepared foods. Counting your daily carbohydrate intake.  Lifestyle If you drink alcohol: Limit how much you have to: 0-1 drink a day for women who are not pregnant. 0-2 drinks a day for men. Know how much alcohol is in your drink. In the [place] one drink equals one 12 oz bottle of beer ( ), one 5 oz glass of wine ( ), or one 1 oz glass of hard liquor (44mL). Do not use any products that contain nicotine or tobacco. These products include cigarettes, chewing tobacco, and vaping devices, such as e-cigarettes. If you need help quitting, ask your health care provider. Avoid secondhand smoke. Do not use drugs. Activity  Try to stay at a healthy  weight. Get at least 30 minutes of exercise on most days, such as: Fast walking. Biking. Swimming. Medicines Take over-the-counter and prescription medicines only as told by your health care provider. Aspirin  or blood thinners (antiplatelets or anticoagulants) may be recommended to reduce your risk of forming blood clots that can lead to stroke. Avoid taking birth control pills. Talk to your health care provider about the risks of taking birth control pills if: You are over 46 years old. You smoke. You get very bad headaches. You have had a blood clot. Where to find more information American Stroke Association: www.strokeassociation.org Get help right away if: You or a loved one has any symptoms of a stroke. "BE FAST" is an easy way to remember the main warning signs of a stroke: B - Balance. Signs are dizziness, sudden trouble walking, or loss of balance. E - Eyes. Signs are trouble seeing or a sudden change in vision. F - Face. Signs are sudden weakness or numbness of the face, or the face or eyelid drooping on one side. A - Arms. Signs are weakness or numbness in an arm. This happens suddenly and usually on one side of the body. S - Speech. Signs are sudden trouble speaking, slurred speech, or trouble understanding what people say. T - Time. Time to call emergency services. Write down what time symptoms started. You or a loved one has other signs of a stroke, such as: A sudden, severe headache with no known cause. Nausea or vomiting. Seizure. These  symptoms may represent a serious problem that is an emergency. Do not wait to see if the symptoms will go away. Get medical help right away. Call your local emergency services (911 in the [place] Do not drive yourself to the hospital. Summary You can help to prevent a stroke by eating healthy, exercising, not smoking, limiting alcohol intake, and managing any medical conditions you may have. Do not use any products that contain nicotine or  tobacco. These include cigarettes, chewing tobacco, and vaping devices, such as e-cigarettes. If you need help quitting, ask your health care provider. Remember "BE FAST" for warning signs of a stroke. Get help right away if you or a loved one has any of these signs. This information is not intended to replace advice given to you by your health care provider. Make sure you discuss any questions you have with your health care provider. Document Revised: 12/11/2021 Document Reviewed: 12/11/2021 Elsevier Patient Education  2024 Elsevier Inc. Discussed:  There is increased risk for stroke in women with migraine with aura and a contraindication for the combined contraceptive pill for use by women who have migraine with aura. The risk for women with migraine without aura is lower. However other risk factors like smoking are far more likely to increase stroke risk than migraine. There is a recommendation for no smoking and for the use of OCPs without estrogen such as progestogen only pills particularly for women with migraine with aura.[name] People who have migraine headaches with auras may be 3 times more likely to have a stroke caused by a blood clot, compared to migraine patients who don't see auras. Women who take hormone-replacement therapy may be 30 percent more likely to suffer a clot-based stroke than women not taking medication containing estrogen. Other risk factors like smoking and high blood pressure may be  much more important. And stroke is still a rare complication due to migraine aura and is controversial and lower doses may not cause a risk.

## 2023-05-29 DIAGNOSIS — Z713 Dietary counseling and surveillance: Secondary | ICD-10-CM | POA: Diagnosis not present

## 2023-06-05 DIAGNOSIS — Z713 Dietary counseling and surveillance: Secondary | ICD-10-CM | POA: Diagnosis not present

## 2023-06-19 DIAGNOSIS — Z713 Dietary counseling and surveillance: Secondary | ICD-10-CM | POA: Diagnosis not present

## 2023-06-20 DIAGNOSIS — G4733 Obstructive sleep apnea (adult) (pediatric): Secondary | ICD-10-CM | POA: Diagnosis not present

## 2023-06-24 DIAGNOSIS — Z01419 Encounter for gynecological examination (general) (routine) without abnormal findings: Secondary | ICD-10-CM | POA: Diagnosis not present

## 2023-06-24 DIAGNOSIS — L4050 Arthropathic psoriasis, unspecified: Secondary | ICD-10-CM | POA: Diagnosis not present

## 2023-07-02 ENCOUNTER — Ambulatory Visit: Payer: BC Managed Care – PPO | Admitting: Neurology

## 2023-07-03 DIAGNOSIS — Z713 Dietary counseling and surveillance: Secondary | ICD-10-CM | POA: Diagnosis not present

## 2023-07-04 DIAGNOSIS — M25561 Pain in right knee: Secondary | ICD-10-CM | POA: Diagnosis not present

## 2023-07-04 DIAGNOSIS — L4050 Arthropathic psoriasis, unspecified: Secondary | ICD-10-CM | POA: Diagnosis not present

## 2023-07-04 DIAGNOSIS — L409 Psoriasis, unspecified: Secondary | ICD-10-CM | POA: Diagnosis not present

## 2023-07-04 DIAGNOSIS — M79643 Pain in unspecified hand: Secondary | ICD-10-CM | POA: Diagnosis not present

## 2023-07-04 DIAGNOSIS — Z79899 Other long term (current) drug therapy: Secondary | ICD-10-CM | POA: Diagnosis not present

## 2023-07-15 ENCOUNTER — Other Ambulatory Visit: Payer: Self-pay | Admitting: Obstetrics and Gynecology

## 2023-07-15 ENCOUNTER — Encounter (HOSPITAL_BASED_OUTPATIENT_CLINIC_OR_DEPARTMENT_OTHER): Admitting: Radiology

## 2023-07-15 DIAGNOSIS — Z713 Dietary counseling and surveillance: Secondary | ICD-10-CM | POA: Diagnosis not present

## 2023-07-15 DIAGNOSIS — Z1231 Encounter for screening mammogram for malignant neoplasm of breast: Secondary | ICD-10-CM

## 2023-07-17 DIAGNOSIS — G4733 Obstructive sleep apnea (adult) (pediatric): Secondary | ICD-10-CM | POA: Diagnosis not present

## 2023-07-18 ENCOUNTER — Ambulatory Visit
Admission: RE | Admit: 2023-07-18 | Discharge: 2023-07-18 | Disposition: A | Discharge status: Home / Self Care | Source: Ambulatory Visit

## 2023-07-18 DIAGNOSIS — Z1231 Encounter for screening mammogram for malignant neoplasm of breast: Secondary | ICD-10-CM

## 2023-08-05 DIAGNOSIS — Z713 Dietary counseling and surveillance: Secondary | ICD-10-CM | POA: Diagnosis not present

## 2023-08-19 DIAGNOSIS — L4050 Arthropathic psoriasis, unspecified: Secondary | ICD-10-CM | POA: Diagnosis not present

## 2023-09-26 DIAGNOSIS — G4733 Obstructive sleep apnea (adult) (pediatric): Secondary | ICD-10-CM | POA: Diagnosis not present

## 2023-09-30 DIAGNOSIS — Z713 Dietary counseling and surveillance: Secondary | ICD-10-CM | POA: Diagnosis not present

## 2023-10-03 DIAGNOSIS — L409 Psoriasis, unspecified: Secondary | ICD-10-CM | POA: Diagnosis not present

## 2023-10-03 DIAGNOSIS — Z23 Encounter for immunization: Secondary | ICD-10-CM | POA: Diagnosis not present

## 2023-10-03 DIAGNOSIS — Z79899 Other long term (current) drug therapy: Secondary | ICD-10-CM | POA: Diagnosis not present

## 2023-10-03 DIAGNOSIS — M79643 Pain in unspecified hand: Secondary | ICD-10-CM | POA: Diagnosis not present

## 2023-10-03 DIAGNOSIS — L4050 Arthropathic psoriasis, unspecified: Secondary | ICD-10-CM | POA: Diagnosis not present

## 2023-10-14 DIAGNOSIS — L4050 Arthropathic psoriasis, unspecified: Secondary | ICD-10-CM | POA: Diagnosis not present

## 2023-10-28 DIAGNOSIS — E669 Obesity, unspecified: Secondary | ICD-10-CM | POA: Diagnosis not present

## 2023-10-28 DIAGNOSIS — I10 Essential (primary) hypertension: Secondary | ICD-10-CM | POA: Diagnosis not present

## 2023-10-28 DIAGNOSIS — E039 Hypothyroidism, unspecified: Secondary | ICD-10-CM | POA: Diagnosis not present

## 2023-12-09 DIAGNOSIS — L4050 Arthropathic psoriasis, unspecified: Secondary | ICD-10-CM | POA: Diagnosis not present

## 2023-12-28 DIAGNOSIS — G4733 Obstructive sleep apnea (adult) (pediatric): Secondary | ICD-10-CM | POA: Diagnosis not present
